# Patient Record
Sex: Male | Born: 1970 | Race: White | Hispanic: No | Marital: Married | Smoking: Current every day smoker
Health system: Southern US, Community
[De-identification: ages and names within clinical notes are randomized; demographics above are authoritative.]

## PROBLEM LIST (undated history)

## (undated) DIAGNOSIS — S20219A Contusion of unspecified front wall of thorax, initial encounter: Secondary | ICD-10-CM

## (undated) DIAGNOSIS — J329 Chronic sinusitis, unspecified: Secondary | ICD-10-CM

## (undated) DIAGNOSIS — B370 Candidal stomatitis: Secondary | ICD-10-CM

## (undated) DIAGNOSIS — S29011A Strain of muscle and tendon of front wall of thorax, initial encounter: Secondary | ICD-10-CM

## (undated) DIAGNOSIS — D72829 Elevated white blood cell count, unspecified: Secondary | ICD-10-CM

## (undated) DIAGNOSIS — J45909 Unspecified asthma, uncomplicated: Secondary | ICD-10-CM

## (undated) DIAGNOSIS — E669 Obesity, unspecified: Secondary | ICD-10-CM

## (undated) DIAGNOSIS — J189 Pneumonia, unspecified organism: Secondary | ICD-10-CM

## (undated) DIAGNOSIS — J310 Chronic rhinitis: Secondary | ICD-10-CM

## (undated) DIAGNOSIS — Z720 Tobacco use: Secondary | ICD-10-CM

## (undated) DIAGNOSIS — G4700 Insomnia, unspecified: Secondary | ICD-10-CM

## (undated) DIAGNOSIS — L02215 Cutaneous abscess of perineum: Secondary | ICD-10-CM

## (undated) DIAGNOSIS — E785 Hyperlipidemia, unspecified: Secondary | ICD-10-CM

## (undated) HISTORY — DX: Pneumonia, unspecified organism: J18.9

## (undated) HISTORY — DX: Tobacco use: Z72.0

## (undated) HISTORY — DX: Unspecified asthma, uncomplicated: J45.909

## (undated) HISTORY — DX: Obesity, unspecified: E66.9

## (undated) HISTORY — DX: Cutaneous abscess of perineum: L02.215

## (undated) HISTORY — DX: Hyperlipidemia, unspecified: E78.5

## (undated) HISTORY — DX: Chronic rhinitis: J31.0

## (undated) HISTORY — PX: HERNIA REPAIR: SHX51

## (undated) HISTORY — DX: Strain of muscle and tendon of front wall of thorax, initial encounter: S29.011A

## (undated) HISTORY — DX: Elevated white blood cell count, unspecified: D72.829

## (undated) HISTORY — DX: Insomnia, unspecified: G47.00

## (undated) HISTORY — DX: Candidal stomatitis: B37.0

## (undated) HISTORY — DX: Chronic sinusitis, unspecified: J32.9

## (undated) HISTORY — DX: Contusion of unspecified front wall of thorax, initial encounter: S20.219A

---

## 2008-06-30 DIAGNOSIS — N491 Inflammatory disorders of spermatic cord, tunica vaginalis and vas deferens: Secondary | ICD-10-CM | POA: Insufficient documentation

## 2008-07-05 DIAGNOSIS — L989 Disorder of the skin and subcutaneous tissue, unspecified: Secondary | ICD-10-CM | POA: Insufficient documentation

## 2010-10-14 ENCOUNTER — Ambulatory Visit: Payer: Self-pay

## 2010-11-08 ENCOUNTER — Ambulatory Visit: Payer: Self-pay

## 2011-04-28 ENCOUNTER — Ambulatory Visit: Payer: Self-pay | Admitting: Family Medicine

## 2011-10-25 ENCOUNTER — Ambulatory Visit: Payer: Self-pay | Admitting: Family Medicine

## 2012-01-17 ENCOUNTER — Observation Stay: Payer: Self-pay | Admitting: Surgery

## 2012-01-17 LAB — CBC WITH DIFFERENTIAL/PLATELET
Basophil #: 0 10*3/uL (ref 0.0–0.1)
Eosinophil #: 0.1 10*3/uL (ref 0.0–0.7)
Eosinophil %: 1.3 %
HGB: 15.3 g/dL (ref 13.0–18.0)
Lymphocyte %: 16.2 %
MCV: 89 fL (ref 80–100)
Neutrophil #: 8.2 10*3/uL — ABNORMAL HIGH (ref 1.4–6.5)
Neutrophil %: 73.6 %
Platelet: 164 10*3/uL (ref 150–440)
RBC: 5.12 10*6/uL (ref 4.40–5.90)
RDW: 13.1 % (ref 11.5–14.5)
WBC: 11.1 10*3/uL — ABNORMAL HIGH (ref 3.8–10.6)

## 2012-01-17 LAB — BASIC METABOLIC PANEL
Anion Gap: 8 (ref 7–16)
BUN: 8 mg/dL (ref 7–18)
Calcium, Total: 8.9 mg/dL (ref 8.5–10.1)
Chloride: 108 mmol/L — ABNORMAL HIGH (ref 98–107)
Co2: 24 mmol/L (ref 21–32)
EGFR (Non-African Amer.): >60
Glucose: 85 mg/dL (ref 65–99)
Osmolality: 277 (ref 275–301)
Potassium: 3.8 mmol/L (ref 3.5–5.1)

## 2012-01-22 LAB — WOUND CULTURE

## 2012-06-20 ENCOUNTER — Ambulatory Visit: Payer: Self-pay | Admitting: Family Medicine

## 2013-01-07 ENCOUNTER — Ambulatory Visit: Payer: Self-pay

## 2013-02-01 ENCOUNTER — Other Ambulatory Visit: Payer: Self-pay | Admitting: Family Medicine

## 2013-02-01 LAB — CBC WITH DIFFERENTIAL/PLATELET
Basophil #: 0.1 10*3/uL (ref 0.0–0.1)
Basophil %: 0.5 %
Eosinophil #: 0.1 10*3/uL (ref 0.0–0.7)
Eosinophil %: 0.5 %
Lymphocyte #: 1.6 10*3/uL (ref 1.0–3.6)
Lymphocyte %: 13.2 %
MCH: 30.7 pg (ref 26.0–34.0)
MCHC: 35.7 g/dL (ref 32.0–36.0)
MCV: 86 fL (ref 80–100)
Monocyte %: 9.7 %
Neutrophil #: 9.5 10*3/uL — ABNORMAL HIGH (ref 1.4–6.5)
Platelet: 175 10*3/uL (ref 150–440)
WBC: 12.4 10*3/uL — ABNORMAL HIGH (ref 3.8–10.6)

## 2013-02-03 ENCOUNTER — Ambulatory Visit: Payer: Self-pay | Admitting: Family Medicine

## 2013-03-18 ENCOUNTER — Ambulatory Visit: Payer: Self-pay | Admitting: Family Medicine

## 2013-04-21 ENCOUNTER — Telehealth: Payer: Self-pay | Admitting: Pulmonary Disease

## 2013-04-21 ENCOUNTER — Encounter: Payer: Self-pay | Admitting: Pulmonary Disease

## 2013-04-21 NOTE — Telephone Encounter (Signed)
Records have been received on pt. ATC pt but # listed is not in service. No alternate #. Will sign off

## 2013-04-22 ENCOUNTER — Encounter: Payer: Self-pay | Admitting: Pulmonary Disease

## 2013-04-22 ENCOUNTER — Encounter (INDEPENDENT_AMBULATORY_CARE_PROVIDER_SITE_OTHER): Payer: Self-pay

## 2013-04-22 ENCOUNTER — Ambulatory Visit (INDEPENDENT_AMBULATORY_CARE_PROVIDER_SITE_OTHER): Payer: Federal, State, Local not specified - PPO | Admitting: Pulmonary Disease

## 2013-04-22 VITALS — BP 134/94 | HR 73 | Temp 98.4°F | Ht 75.0 in | Wt 276.0 lb

## 2013-04-22 DIAGNOSIS — Z720 Tobacco use: Secondary | ICD-10-CM

## 2013-04-22 DIAGNOSIS — J189 Pneumonia, unspecified organism: Secondary | ICD-10-CM

## 2013-04-22 DIAGNOSIS — R9389 Abnormal findings on diagnostic imaging of other specified body structures: Secondary | ICD-10-CM

## 2013-04-22 DIAGNOSIS — R918 Other nonspecific abnormal finding of lung field: Secondary | ICD-10-CM

## 2013-04-22 DIAGNOSIS — F17200 Nicotine dependence, unspecified, uncomplicated: Secondary | ICD-10-CM

## 2013-04-22 DIAGNOSIS — J449 Chronic obstructive pulmonary disease, unspecified: Secondary | ICD-10-CM

## 2013-04-22 NOTE — Progress Notes (Signed)
Subjective:    Patient ID: Wayne Gonzalez, male    DOB: Nov 24, 1970, 42 y.o.   MRN: [id number]  HPI   this is a very pleasant 42 year old male who comes to our clinic today for evaluation of shortness of breath, cough, and recurrent pneumonia. He started smoking at age 31 and has smoked about one pack a day since then. This is been associated with a chronic cough with yellow phlegm production on a daily basis.  As a child used to have recurrent bronchitis which looking back, he questions whether or not he may of had some asthma.  Over his adult life he has had recurrent bronchitis at least twice a year. However, in 2012 he started developing pneumonia on a recurrent basis. He believes that he has had it least 3 times since then. He has been treated multiple times with multiple antibiotics.  Most recently he had an episode of bronchitis in September of 2014. During this time he had an increase cough he actually initially could not get up in the sputum. After about a week or so he started producing a milky white sputum.   In June of 2014 he was diagnosed with sinusitis. This eventually lead to a worsening cough with associated fever. He was treated with Levaquin for 10 days. During this time he was also treated with 20 days of doxycycline because of the recurrent fever for possible tick borne illness. Apparently labwork was all negative at that time. He had a fever intermittently with these symptoms. He was treated with Levaquin for approximately  He is from [place] originally but ever since moving to [place] he has had seasonal allergies. This is mostly nasal congestion which persists most of the time. He takes Claritin and a nasal steroid which helped.   Past Medical History  Diagnosis Date  . Obesity   . Perineal abscess   . Contusion, chest wall   . Insomnia   . Chest wall muscle strain   . Reactive airway disease   . Thrush, oral   . PNA (pneumonia)   . Elevated WBC count   .  Rhinitis   . Asthmatic bronchitis   . Hyperlipidemia   . Tobacco use     quit 05/14/2007  . Sinusitis      Family History  Problem Relation Age of Onset  . Emphysema Paternal Grandfather     smoked  . Emphysema Paternal Grandmother     smoked  . Lung cancer Paternal Grandfather     smoked  . Lung cancer Maternal Grandmother     smoked     History   Social History  . Marital Status: Married    Spouse Name: N/A    Number of Children: N/A  . Years of Education: N/A   Occupational History  . Teaching laboratory technician    Social History Main Topics  . Smoking status: Current Every Day Smoker -- 1.00 packs/day for 22 years    Types: Cigarettes  . Smokeless tobacco: Never Used  . Alcohol Use: No     Comment: occassional  . Drug Use: No  . Sexual Activity: Not on file   Other Topics Concern  . Not on file   Social History Narrative  . No narrative on file     No Known Allergies   Outpatient Prescriptions Prior to Visit  Medication Sig Dispense Refill  . atorvastatin (LIPITOR) 20 MG tablet Take 20 mg by mouth daily.      Marland Kitchen  fish oil-omega-3 fatty acids 1000 MG capsule Take 1 g by mouth daily.      . fluticasone (FLONASE) 50 MCG/ACT nasal spray Place 2 sprays into the nose daily.      . Fluticasone-Salmeterol (ADVAIR) 250-50 MCG/DOSE AEPB Inhale 1 puff into the lungs every 12 (twelve) hours.      . Loratadine (CLARITIN) 10 MG CAPS Take 1 capsule by mouth daily.      . montelukast (SINGULAIR) 10 MG tablet Take 10 mg by mouth at bedtime.      . Multiple Vitamin (MULTI-VITAMIN DAILY PO) Take 1 tablet by mouth daily.       No facility-administered medications prior to visit.      Review of Systems  Constitutional: Negative for fever, chills, activity change and appetite change.  HENT: Positive for congestion. Negative for ear pain, hearing loss, postnasal drip, rhinorrhea, sinus pressure and sneezing.   Eyes: Negative for redness, itching and visual  disturbance.  Respiratory: Positive for cough. Negative for chest tightness, shortness of breath and wheezing.   Cardiovascular: Negative for chest pain, palpitations and leg swelling.  Gastrointestinal: Negative for nausea, vomiting, abdominal pain, diarrhea, constipation, blood in stool and abdominal distention.  Musculoskeletal: Negative for arthralgias, gait problem, joint swelling, myalgias, neck pain and neck stiffness.  Skin: Negative for rash.  Neurological: Negative for dizziness, light-headedness, numbness and headaches.  Hematological: Does not bruise/bleed easily.  Psychiatric/Behavioral: Negative for confusion and dysphoric mood.       Objective:   Physical Exam Filed Vitals:   04/22/13 1548  BP: 134/94  Pulse: 73  Temp: 98.4 F (36.9 C)  TempSrc: Oral  Height: 6\' 3"  (1.905 m)  Weight: 276 lb (125.193 kg)  SpO2: 96%  RA Walked 500 feet in office on RA and did not have significant desaturation  Gen: well appearing, no acute distress HEENT: NCAT, PERRL, EOMi, OP clear, neck supple without masses PULM: CTA B CV: RRR, no mgr, no JVD AB: BS+, soft, nontender, no hsm Ext: warm, no edema, no clubbing, no cyanosis Derm: no rash or skin breakdown Neuro: A&Ox4, CN II-XII intact, strength 5/5 in all 4 extremities  Multiple chest x-rays were reviewed, including the most recent from October of 2014 which showed clear lungs bilaterally with the exception of a very small amount of linear atelectasis in the bases records and left.  04/22/2013 simple spirometry (without bronchodilator)>> ratio 66%, FEV1 3.67 L (78% predicted)      Assessment & Plan:   Chronic airway obstruction, not elsewhere classified Wayne Gonzalez is at risk for having COPD. Today he had airflow obstruction on his simple spirometry test but this was not performed after the use of a bronchodilator. Therefore, it's not clear to me if this is COPD or asthma. He has had multiple episodes of bronchitis throughout  his life which make me question whether or not there is some underlying asthma. This is never really been thoroughly evaluated in the past.   So at this point we are going to treat him with Advair which would treat both asthma and COPD. He needs to quit smoking immediately. We will set up. Pulmonary function testing at [facility] with bronchodilator to see if in fact he does have COPD.  Plan: -Full PFT with bronchodilator -Continue Advair -Quit smoking, quit smoking, quit smoking  Tobacco abuse We talked about this at length today. He wants to try quit cold [place] at this point. We talked about the side effects and benefits of Chantix. At this  point he is not interested in using that.  Abnormal chest x-ray He had a very mild amount of linear scarring in the base of his lung which I believe was related to prior episodes of pneumonia. His physical exam was normal today. I explained to him that these findings are nonspecific.  Plan: -Quit smoking -Full pulmonary function test -Repeat chest x-ray in one month after quitting smoking -If no change in scarring then order a CT scan  Recurrent pneumonia I believe that his recurrent sinopulmonary infections are do to his tobacco use first, perhaps an underlying obstructive lung disease (see discussion above) and less likely an immune deficiency of some sort. His most recent bloodwork which I was able to review thanks to the records sent by his primary care office show a normal white count and normal neutrophil count. I am going to order complement studies as well as an immunoglobulin profile to look for evidence of an underlying immunodeficiency of some sort.    Updated Medication List Outpatient Encounter Prescriptions as of 04/22/2013  Medication Sig Dispense Refill  . atorvastatin (LIPITOR) 20 MG tablet Take 20 mg by mouth daily.      . fish oil-omega-3 fatty acids 1000 MG capsule Take 1 g by mouth daily.      . fluticasone (FLONASE) 50 MCG/ACT  nasal spray Place 2 sprays into the nose daily.      . Fluticasone-Salmeterol (ADVAIR) 250-50 MCG/DOSE AEPB Inhale 1 puff into the lungs every 12 (twelve) hours.      . Loratadine (CLARITIN) 10 MG CAPS Take 1 capsule by mouth daily.      . montelukast (SINGULAIR) 10 MG tablet Take 10 mg by mouth at bedtime.      . Multiple Vitamin (MULTI-VITAMIN DAILY PO) Take 1 tablet by mouth daily.      Marland Kitchen terbinafine (LAMISIL) 250 MG tablet Take 250 mg by mouth daily.       No facility-administered encounter medications on file as of 04/22/2013.

## 2013-04-22 NOTE — Patient Instructions (Addendum)
Quit smoking, quit smoking, quit smoking We will set you up for full pulmonary function testing at [facility] with and without a bronchodilator We will have you get a Chest X-ray at [facility] before the next visit We will call you with the results of your blood work We will see you back in 4 weeks or sooner if needed

## 2013-04-23 DIAGNOSIS — J449 Chronic obstructive pulmonary disease, unspecified: Secondary | ICD-10-CM | POA: Insufficient documentation

## 2013-04-23 DIAGNOSIS — Z720 Tobacco use: Secondary | ICD-10-CM | POA: Insufficient documentation

## 2013-04-23 DIAGNOSIS — R9389 Abnormal findings on diagnostic imaging of other specified body structures: Secondary | ICD-10-CM | POA: Insufficient documentation

## 2013-04-23 DIAGNOSIS — J189 Pneumonia, unspecified organism: Secondary | ICD-10-CM | POA: Insufficient documentation

## 2013-04-23 LAB — IGG, IGA, IGM
IgA: 288 mg/dL (ref 68–379)
IgG (Immunoglobin G), Serum: 1070 mg/dL (ref 650–1600)
IgM, Serum: 112 mg/dL (ref 41–251)

## 2013-04-23 NOTE — Assessment & Plan Note (Addendum)
Wayne Gonzalez is at risk for having COPD. Today he had airflow obstruction on his simple spirometry test but this was not performed after the use of a bronchodilator. Therefore, it's not clear to me if this is COPD or asthma. He has had multiple episodes of bronchitis throughout his life which make me question whether or not there is some underlying asthma. This is never really been thoroughly evaluated in the past.   So at this point we are going to treat him with Advair which would treat both asthma and COPD. He needs to quit smoking immediately. We will set up. Pulmonary function testing at [facility] with bronchodilator to see if in fact he does have COPD.  Plan: -Full PFT with bronchodilator -Continue Advair -Quit smoking, quit smoking, quit smoking

## 2013-04-23 NOTE — Assessment & Plan Note (Signed)
He had a very mild amount of linear scarring in the base of his lung which I believe was related to prior episodes of pneumonia. His physical exam was normal today. I explained to him that these findings are nonspecific.  Plan: -Quit smoking -Full pulmonary function test -Repeat chest x-ray in one month after quitting smoking -If no change in scarring then order a CT scan

## 2013-04-23 NOTE — Assessment & Plan Note (Signed)
We talked about this at length today. He wants to try quit cold [place] at this point. We talked about the side effects and benefits of Chantix. At this point he is not interested in using that.

## 2013-04-23 NOTE — Assessment & Plan Note (Signed)
I believe that his recurrent sinopulmonary infections are do to his tobacco use first, perhaps an underlying obstructive lung disease (see discussion above) and less likely an immune deficiency of some sort. His most recent bloodwork which I was able to review thanks to the records sent by his primary care office show a normal white count and normal neutrophil count. I am going to order complement studies as well as an immunoglobulin profile to look for evidence of an underlying immunodeficiency of some sort.

## 2013-04-24 LAB — COMPLEMENT, TOTAL: Compl, Total (CH50): 51 U/mL (ref 31–60)

## 2013-04-24 NOTE — Progress Notes (Signed)
Quick Note:  Advised pt of labs per DM. Pt aware not all labs in and will be notified if any are abnormal.  Pt verbalized understanding and has no further questions ______

## 2013-05-01 ENCOUNTER — Ambulatory Visit: Payer: Self-pay | Admitting: Pulmonary Disease

## 2013-05-01 LAB — PULMONARY FUNCTION TEST

## 2013-05-08 ENCOUNTER — Telehealth: Payer: Self-pay | Admitting: *Deleted

## 2013-05-08 ENCOUNTER — Encounter: Payer: Self-pay | Admitting: Pulmonary Disease

## 2013-05-08 NOTE — Telephone Encounter (Signed)
Message copied by Caryl Ada on Thu May 08, 2013  4:57 PM ------      Message from: Max Fickle B      Created: Thu May 08, 2013  6:48 AM       L,            Please let him know that his PFT's showed mild COPD.  I don't have any changes to what I told him at the last visit.             Thanks      B ------

## 2013-05-08 NOTE — Telephone Encounter (Signed)
Pt is aware of PFT results. 

## 2013-05-20 ENCOUNTER — Ambulatory Visit: Payer: Federal, State, Local not specified - PPO | Admitting: Pulmonary Disease

## 2013-05-26 ENCOUNTER — Ambulatory Visit: Payer: Self-pay | Admitting: Pulmonary Disease

## 2013-05-28 ENCOUNTER — Encounter: Payer: Self-pay | Admitting: Pulmonary Disease

## 2013-05-28 ENCOUNTER — Ambulatory Visit (INDEPENDENT_AMBULATORY_CARE_PROVIDER_SITE_OTHER): Payer: Federal, State, Local not specified - PPO | Admitting: Pulmonary Disease

## 2013-05-28 VITALS — BP 146/94 | HR 70 | Temp 98.6°F | Ht 75.0 in | Wt 278.0 lb

## 2013-05-28 DIAGNOSIS — F17200 Nicotine dependence, unspecified, uncomplicated: Secondary | ICD-10-CM

## 2013-05-28 DIAGNOSIS — Z720 Tobacco use: Secondary | ICD-10-CM

## 2013-05-28 DIAGNOSIS — J449 Chronic obstructive pulmonary disease, unspecified: Secondary | ICD-10-CM

## 2013-05-28 DIAGNOSIS — J189 Pneumonia, unspecified organism: Secondary | ICD-10-CM

## 2013-05-28 NOTE — Assessment & Plan Note (Signed)
Extensive discussion today regarding the importance of quitting smoking. In fact, I counseled him for approximately 30 minutes regarding COPD education and the importance of quitting smoking. I am hopeful that he is going to quit cold [place] soon.

## 2013-05-28 NOTE — Progress Notes (Signed)
Subjective:    Patient ID: Wayne Gonzalez, male    DOB: 1971-06-07, 42 y.o.   MRN: [id number]  Synopsis: Mr. Couts first saw the [facility] Pulmonary office in November 2014 for shortness of breath.  He is an active smoker and was diagnosed with COPD at that time: 04/22/2013 simple spirometry (without bronchodilator)>> ratio 66%, FEV1 3.67 L (78% predicted) 04/2013 Full PFT Ratio 64% FEV1 3.79L (88% pred), only 4% change with Bronchodilator; TLC 7.52 L (91% pre)RV 1.53 (64% pre), ERV 0.78 (40% pred), DLCO 27.6 (77% pred) He has frequent exacerbations  HPI  05/28/2013 > Mr. Villamizar has been doing well since the last visit. His cough has nearly completely resolved and is now back to his "smoker's cough". He has been using the Advair and feels like it it helps control chest congestion and shortness of breath. He did note some shortness of breath with yard work about a week ago. In general, he is feeling much better than he was back in the summertime.  Past Medical History  Diagnosis Date  . Obesity   . Perineal abscess   . Contusion, chest wall   . Insomnia   . Chest wall muscle strain   . Reactive airway disease   . Thrush, oral   . PNA (pneumonia)   . Elevated WBC count   . Rhinitis   . Asthmatic bronchitis   . Hyperlipidemia   . Tobacco use     quit 05/14/2007  . Sinusitis      Review of Systems  Constitutional: Negative for fever, chills and fatigue.  HENT: Negative for postnasal drip, rhinorrhea and sinus pressure.   Respiratory: Positive for cough and shortness of breath. Negative for wheezing.   Cardiovascular: Negative for chest pain, palpitations and leg swelling.       Objective:   Physical Exam  Filed Vitals:   05/28/13 1609  BP: 146/94  Pulse: 70  Temp: 98.6 F (37 C)  TempSrc: Oral  Height: 6\' 3"  (1.905 m)  Weight: 278 lb (126.1 kg)  SpO2: 96%   Gen: well appearing HEENT: NCAT, EOMi PULM: CTA B CV: RRR, no MGR AB: soft, nontender Ext: warm, no  edema  05/26/2013 CXR reviewed> chronic scar R base     Assessment & Plan:   COPD He definitely has COPD which showed an FEV1 of 88% predicted on pulmonary function testing at [facility]. Because of his frequent exacerbations I consider him a gold grade C.  Plan: -Quit smoking, quit smoking, quit smoking -Continue Advair -Exercise regularly -Flu shot is up-to-date -Pneumonia vaccine today  Recurrent pneumonia No evidence of immunodeficiency seen on lab workup..  The chest x-ray from [place] just shows a chronic scar in the right base which has not changed. We will repeat a chest x-ray in about 6 months to make sure this has not changed.  Tobacco abuse Extensive discussion today regarding the importance of quitting smoking. In fact, I counseled him for approximately 30 minutes regarding COPD education and the importance of quitting smoking. I am hopeful that he is going to quit cold [place] soon.    Updated Medication List Outpatient Encounter Prescriptions as of 05/28/2013  Medication Sig  . atorvastatin (LIPITOR) 20 MG tablet Take 20 mg by mouth daily.  . fish oil-omega-3 fatty acids 1000 MG capsule Take 1 g by mouth daily.  . fluticasone (FLONASE) 50 MCG/ACT nasal spray Place 2 sprays into the nose daily.  . Fluticasone-Salmeterol (ADVAIR) 250-50 MCG/DOSE AEPB  Inhale 1 puff into the lungs every 12 (twelve) hours.  . Loratadine (CLARITIN) 10 MG CAPS Take 1 capsule by mouth daily.  . montelukast (SINGULAIR) 10 MG tablet Take 10 mg by mouth at bedtime.  . Multiple Vitamin (MULTI-VITAMIN DAILY PO) Take 1 tablet by mouth daily.  Marland Kitchen terbinafine (LAMISIL) 250 MG tablet Take 250 mg by mouth daily.

## 2013-05-28 NOTE — Assessment & Plan Note (Addendum)
He definitely has COPD which showed an FEV1 of 88% predicted on pulmonary function testing at [facility]. Because of his frequent exacerbations I consider him a gold grade C.  Plan: -Quit smoking, quit smoking, quit smoking -Continue Advair -Exercise regularly -Flu shot is up-to-date -Pneumonia vaccine today

## 2013-05-28 NOTE — Patient Instructions (Signed)
Stop smoking Keep using the Advair twice a day Exercise regularly We will see you back in June 2015 with a Chest X-ray or sooner if needed

## 2013-05-28 NOTE — Assessment & Plan Note (Addendum)
No evidence of immunodeficiency seen on lab workup..  The chest x-ray from Wayne Gonzalez just shows a chronic scar in the right base which has not changed. We will repeat a chest x-ray in about 6 months to make sure this has not changed.

## 2013-05-29 ENCOUNTER — Encounter: Payer: Self-pay | Admitting: Pulmonary Disease

## 2013-05-30 ENCOUNTER — Encounter: Payer: Self-pay | Admitting: Pulmonary Disease

## 2013-07-09 ENCOUNTER — Encounter: Payer: Self-pay | Admitting: Pulmonary Disease

## 2014-02-27 LAB — BASIC METABOLIC PANEL
BUN: 10 mg/dL (ref 4–21)
Creatinine: 1 mg/dL (ref 0.6–1.3)
Glucose: 89 mg/dL
Potassium: 4.4 mmol/L (ref 3.4–5.3)
Sodium: 139 mmol/L (ref 137–147)

## 2014-02-27 LAB — CBC AND DIFFERENTIAL
HCT: 48 % (ref 41–53)
Hemoglobin: 16.6 g/dL (ref 13.5–17.5)
Platelets: 215 10*3/uL (ref 150–399)
WBC: 6.9 10^3/mL

## 2014-02-27 LAB — LIPID PANEL
Cholesterol: 146 mg/dL (ref 0–200)
HDL: 31 mg/dL — AB (ref 35–70)
LDL Cholesterol: 96 mg/dL
Triglycerides: 95 mg/dL (ref 40–160)

## 2014-02-27 LAB — HEMOGLOBIN A1C: Hgb A1c MFr Bld: 16.6 % — AB (ref 4.0–6.0)

## 2014-02-27 LAB — HEPATIC FUNCTION PANEL
ALT: 35 U/L (ref 10–40)
AST: 33 U/L (ref 14–40)

## 2014-03-09 ENCOUNTER — Ambulatory Visit: Payer: Self-pay | Admitting: Family Medicine

## 2014-10-13 NOTE — Op Note (Signed)
PATIENT NAME:  Wayne Gonzalez, Wayne Gonzalez MR#:  [id number] DATE OF BIRTH:  10-25-1970  DATE OF PROCEDURE:  01/17/2012  PREOPERATIVE DIAGNOSIS: Perineal abscess.   POSTOPERATIVE DIAGNOSIS: Perineal abscess.  PROCEDURES PERFORMED: Examination under anesthesia, Foley catheter placement, and incision and drainage of perineal abscess.   SURGEON: Molly Savarino E. Excell Seltzerooper, MD  ASSISTANT: Mack HookNicole Papsworth-Jones  ANESTHESIA: General with LMA   INDICATIONS: This is a patient with a perineal abscess requiring incision and drainage. Preoperatively we discussed rationale for surgery, the options of observation, risk of bleeding, infection, recurrence, and open wound. This was reviewed for him and his wife. They understood and agreed to proceed.   FINDINGS: Foley catheter was placed in close proximity to the perineal urethra prior to drainage and then the drainage was performed to the right of the midline with a large amount of pus exuding, cultures were taken, and a Foley catheter was left in place.   DESCRIPTION OF PROCEDURE: The patient was induced to general anesthesia and then placed into a well-padded high lithotomy position by the nursing staff. He was then prepped and draped in a sterile fashion. Examination under anesthesia demonstrated that this was very close to the perineal body and the midline perineum; therefore, a Foley catheter was placed in the standard fashion using aseptic technique. It had been prepped into the field. Palpation demonstrated an area of fluctuance posterior and to the right of the midline. An incision was made and enlarged to allow for good drainage. Cultures were taken of a large amount of pus. Drain irrigation was performed and then packing with 1 inch Nu Gauze was performed. A sterile dressing was placed after cultures were taken and he was taken down out of lithotomy position. The catheter was left in place.   The patient tolerated this procedure well. There were no complications. He was  taken to the recovery room in stable condition to be admitted for continued care.  ____________________________ Adah Salvageichard E. Excell Seltzerooper, MD rec:slb D: 01/17/2012 09:41:42 ET T: 01/17/2012 10:04:46 ET JOB#: [id number]  cc: Adah Salvageichard E. Excell Seltzerooper, MD, <Dictator> Lattie HawICHARD E Ranessa Kosta MD ELECTRONICALLY SIGNED 01/17/2012 10:55

## 2014-10-13 NOTE — H&P (Signed)
PATIENT NAME:  Wayne Gonzalez, [name] MR#:  [id number] DATE OF BIRTH:  12/05/1970  DATE OF ADMISSION:  01/17/2012  PRIMARY CARE PHYSICIAN: Mila Merryonald Fisher, MD   ADMITTING PHYSICIAN: Dr. Michela PitcherEly   CHIEF COMPLAINT: Perirectal pain with abscess.   BRIEF HISTORY: Wayne Gonzalez is a 44 year old gentleman with large perineal abscess. He's had symptoms over the last several days. He went to his primary care physician today and was set up for surgical evaluation. The pain worsened over the course of the afternoon and he felt that he could not wait until he was evaluated in the office. He presented to the Emergency Room for further evaluation and intervention. Work-up in the Emergency Room revealed a large abscess on physical examination and white blood cell count of 11,000. Abdominal/pelvic CT was performed without p.o. contrast and this study confirmed the presence of a large abscess. He does have history of a pilonidal cyst and abscess which has drained previously. He has also had a history of multiple previous perineal abscesses which have all drained spontaneously, essentially one per year. This is the first abscess that has not drained spontaneously.   He denies history of hepatitis, yellow jaundice, pancreatitis, peptic ulcer disease, gallbladder disease, diverticulitis, or inflammatory bowel disease. He has no history of cardiac disease, hypertension, or diabetes. He does have history of hyperlipidemia.   FAMILY HISTORY: Diabetes and coronary artery disease.   CURRENT MEDICATIONS: Generic Lipitor.   ALLERGIES: He has no medical allergies.   SOCIAL HISTORY: He is a cigarette smoker. Drinks alcohol occasionally. He is a Technical sales engineerreal estate appraiser. His wife accompanies him tonight.   REVIEW OF SYSTEMS: Review of systems is carried out and is unremarkable.    PHYSICAL EXAMINATION:   GENERAL: He is an alert slightly agitated gentleman in no significant distress.   VITAL SIGNS: He remains afebrile. Blood pressure  121/80, heart rate 92 and regular, pain on a scale of 1 to 10 is a 5.   HEENT: Unremarkable. He has no scleral icterus. Pupils were equally round. There is no facial deformity.   NECK: Supple without adenopathy. Trachea is midline. I cannot palpate his thyroid gland.   CHEST: Clear with no adventitious sounds. He has normal pulmonary excursion.   CARDIAC: No murmurs or gallops. He seems to be in normal sinus rhythm.   ABDOMEN: His abdomen is benign with no abdominal tenderness. No rebound. No guarding. No masses. No hernias.   RECTAL: Rectal exam demonstrates a large 8 to 9 cm fluctuant abscess at the 12 o'clock position in lithotomy. There is no spontaneous drainage. The skin is not discolored but it is obviously soft. There is no other rectal abnormality noted on inspection. Digital examination was not performed. Scrotum appears to be uninvolved.   EXTREMITIES: Lower extremity exam reveals full range of motion. No deformities. Good distal pulses.   PSYCHIATRIC: Normal affect other than some mild anxiety and normal orientation.   I have independently reviewed his CT scan. He does appear to have a large perineal abscess. Will recommend admitting him to the hospital for observation, place him on IV antibiotics and IV rehydration, keep him n.p.o. and perform incision and drainage procedure in the morning. This plan has been outlined for the patient and his wife and they are in agreement.   ____________________________ Carmie Endalph L. Ely III, MD rle:drc D: 01/17/2012 02:52:35 ET T: 01/17/2012 08:08:27 ET JOB#: [id number] cc: Carmie Endalph L. Ely III, MD, <Dictator>, Demetrios Isaacsonald E. Sherrie MustacheFisher, MD Quentin OreALPH L ELY MD ELECTRONICALLY SIGNED 01/18/2012 1:23

## 2014-10-13 NOTE — Discharge Summary (Signed)
PATIENT NAME:  Wayne Gonzalez, Wayne Gonzalez MR#:  [id number] DATE OF BIRTH:  16-May-1971  DATE OF ADMISSION:  01/17/2012 DATE OF DISCHARGE:  01/18/2012  DIAGNOSES:  1. Perineal abscess.  2. Hypercholesterolemia.   PROCEDURES: Incision and drainage of perineal abscess and Foley catheter placement intraoperatively with exam under anesthesia.   HISTORY OF PRESENT ILLNESS/HOSPITAL COURSE: This is a 44 year old Caucasian male patient. He has had previous infections of a similar nature but he presented with increasing pain and mass sensation in his perineum. There were clear-cut signs of perineal abscess but a CT scan had been performed suggesting and confirming the presence of a perineal abscess. He was taken to the operating room where a Foley catheter was placed due to the proximity of the perineal urethra and an incision and drainage was performed. Initial Gram stain showed few or rare Gram-positive cocci and many white blood cells. Cultures are currently pending. He is discharged in stable condition after the packing has been removed and the catheter has been removed to follow up in my office next week. He will be given Keflex orally q.i.d. and Percocet for pain.    ____________________________ Adah Salvageichard E. Excell Seltzerooper, MD rec:bjt D: 01/18/2012 08:04:30 ET T: 01/18/2012 13:02:05 ET JOB#: [id number]  cc: Adah Salvageichard E. Excell Seltzerooper, MD, <Dictator> Lattie HawICHARD E Sicilia Killough MD ELECTRONICALLY SIGNED 01/18/2012 18:30

## 2014-12-25 ENCOUNTER — Encounter: Payer: Self-pay | Admitting: Family Medicine

## 2014-12-25 ENCOUNTER — Ambulatory Visit (INDEPENDENT_AMBULATORY_CARE_PROVIDER_SITE_OTHER): Payer: Federal, State, Local not specified - PPO | Admitting: Family Medicine

## 2014-12-25 VITALS — BP 138/90 | HR 88 | Temp 98.7°F | Resp 16 | Ht 75.0 in | Wt 282.0 lb

## 2014-12-25 DIAGNOSIS — G4700 Insomnia, unspecified: Secondary | ICD-10-CM | POA: Insufficient documentation

## 2014-12-25 DIAGNOSIS — Z716 Tobacco abuse counseling: Secondary | ICD-10-CM | POA: Diagnosis not present

## 2014-12-25 DIAGNOSIS — E785 Hyperlipidemia, unspecified: Secondary | ICD-10-CM | POA: Insufficient documentation

## 2014-12-25 DIAGNOSIS — R509 Fever, unspecified: Secondary | ICD-10-CM | POA: Diagnosis not present

## 2014-12-25 DIAGNOSIS — J40 Bronchitis, not specified as acute or chronic: Secondary | ICD-10-CM | POA: Diagnosis not present

## 2014-12-25 DIAGNOSIS — J45909 Unspecified asthma, uncomplicated: Secondary | ICD-10-CM | POA: Insufficient documentation

## 2014-12-25 DIAGNOSIS — D72829 Elevated white blood cell count, unspecified: Secondary | ICD-10-CM | POA: Insufficient documentation

## 2014-12-25 DIAGNOSIS — E669 Obesity, unspecified: Secondary | ICD-10-CM | POA: Insufficient documentation

## 2014-12-25 DIAGNOSIS — Z720 Tobacco use: Secondary | ICD-10-CM

## 2014-12-25 DIAGNOSIS — J309 Allergic rhinitis, unspecified: Secondary | ICD-10-CM | POA: Insufficient documentation

## 2014-12-25 DIAGNOSIS — J069 Acute upper respiratory infection, unspecified: Secondary | ICD-10-CM

## 2014-12-25 MED ORDER — LEVOFLOXACIN 500 MG PO TABS: 500.0000 mg | ORAL_TABLET | Freq: Every day | ORAL | Status: DC

## 2014-12-25 MED ORDER — PREDNISONE 10 MG PO TABS: 10.0000 mg | ORAL_TABLET | Freq: Every day | ORAL | Status: DC

## 2014-12-25 NOTE — Progress Notes (Signed)
Subjective:    Patient ID: Wayne Gonzalez, male    DOB: [birth year], 44 y.o.   MRN: [id number]  URI  This is a new problem. The current episode started in the past 7 days. The problem has been gradually worsening. The maximum temperature recorded prior to his arrival was 100.4 - 100.9 F. The fever has been present for 3 to 4 days. Associated symptoms include coughing, rhinorrhea and wheezing. Pertinent negatives include no abdominal pain, chest pain, congestion, diarrhea, ear pain, headaches, nausea, plugged ear sensation, sinus pain, sneezing, sore throat or vomiting. He has tried NSAIDs for the symptoms. The treatment provided mild relief.   Patient Active Problem List   Diagnosis Date Noted  . Allergic rhinitis 12/25/2014  . Airway hyperreactivity 12/25/2014  . COPD, moderate 12/25/2014  . Elevated WBC count 12/25/2014  . Combined fat and carbohydrate induced hyperlipemia 12/25/2014  . Cannot sleep 12/25/2014  . Adiposity 12/25/2014  . RAD (reactive airway disease) 12/25/2014  . COPD 04/23/2013  . Tobacco abuse 04/23/2013  . Abnormal chest x-ray 04/23/2013  . Recurrent pneumonia 04/23/2013  . Disorder of skin 07/05/2008  . Abscess of vas deferens 06/30/2008  . Current tobacco use 05/14/2007   Family History  Problem Relation Age of Onset  . Emphysema Paternal Grandfather     smoked  . Lung cancer Paternal Grandfather     smoked  . Emphysema Paternal Grandmother     smoked  . Lung cancer Maternal Grandmother     smoked  . Depression Maternal Grandmother   . Hyperlipidemia Father   . Hypertension Father   . Fibromyalgia Father   . Hypothyroidism Mother    History   Social History  . Marital Status: Married    Spouse Name: N/A  . Number of Children: 2  . Years of Education: College   Occupational History  . Teaching laboratory technician    Social History Main Topics  . Smoking status: Current Every Day Smoker -- 1.00 packs/day for 22 years    Types: Cigarettes   . Smokeless tobacco: Never Used  . Alcohol Use: No     Comment: Rarely Rarely  . Drug Use: No  . Sexual Activity: Not on file   Other Topics Concern  . Not on file   Social History Narrative   Past Surgical History  Procedure Laterality Date  . Hernia repair      x 2    No Known Allergies Previous Medications   ALBUTEROL (VENTOLIN HFA) 108 (90 BASE) MCG/ACT INHALER    Inhale into the lungs.   ATORVASTATIN (LIPITOR) 20 MG TABLET    Take 20 mg by mouth daily.   FISH OIL-OMEGA-3 FATTY ACIDS 1000 MG CAPSULE    Take 1 g by mouth daily.   FLUTICASONE (FLONASE) 50 MCG/ACT NASAL SPRAY    Place 2 sprays into the nose daily.   FLUTICASONE-SALMETEROL (ADVAIR) 250-50 MCG/DOSE AEPB    Inhale 1 puff into the lungs every 12 (twelve) hours.   LORATADINE (CLARITIN) 10 MG CAPS    Take 1 capsule by mouth daily.   MONTELUKAST (SINGULAIR) 10 MG TABLET    Take 10 mg by mouth at bedtime.   MULTIPLE VITAMIN (MULTI-VITAMIN DAILY PO)    Take 1 tablet by mouth daily.   OXYMETAZOLINE (AFRIN 12 HOUR) 0.05 % NASAL SPRAY    Place into the nose.   TERBINAFINE (LAMISIL) 250 MG TABLET    Take 250 mg by mouth daily.    BP  138/90 mmHg  Pulse 88  Temp(Src) 98.7 F (37.1 C) (Oral)  Resp 16  Ht  (1.905 m)  Wt 282 lb (127.914 kg)  BMI 35.25 kg/m2  SpO2 96%     Review of Systems  Constitutional: Positive for fever ( Low grade fever the last three nights.  ), chills and fatigue. Negative for activity change, appetite change and unexpected weight change.  HENT: Positive for rhinorrhea. Negative for congestion, dental problem, drooling, ear discharge, ear pain, facial swelling, hearing loss, mouth sores, nosebleeds, sinus pressure, sneezing, sore throat, tinnitus, trouble swallowing and voice change.   Eyes: Negative for photophobia, pain, discharge, redness, itching and visual disturbance.  Respiratory: Positive for cough, chest tightness, shortness of breath and wheezing. Negative for choking.    Cardiovascular: Negative for chest pain, palpitations and leg swelling.  Gastrointestinal: Negative for nausea, vomiting, abdominal pain, diarrhea, constipation, blood in stool, abdominal distention, anal bleeding and rectal pain.  Neurological: Negative for dizziness, tremors, seizures, syncope, facial asymmetry, speech difficulty, weakness, light-headedness, numbness and headaches.       Objective:   Physical Exam  Constitutional: He is oriented to person, place, and time. He appears well-developed and well-nourished.  Cardiovascular: Normal rate and regular rhythm.   Pulmonary/Chest: Effort normal. Respiratory distress: slight scattered wheezing.  He has wheezes. Tenderness: left lateral ribs.   Neurological: He is alert and oriented to person, place, and time.  Psychiatric: He has a normal mood and affect. His behavior is normal. Judgment and thought content normal.       Assessment & Plan:   1. Upper respiratory infection Worsening, see below.   2. Bronchitis Worsening. Will start medication as below.  Call if worsens or does not improve.  - levofloxacin (LEVAQUIN) 500 MG tablet; Take 1 tablet (500 mg total) by mouth daily.  Dispense: 7 tablet; Refill: 0 - predniSONE (DELTASONE) 10 MG tablet; Take 1 tablet (10 mg total) by mouth daily with breakfast. 6 po for 1 day and 5 po for 1 day and 4 po for 1 day and 3 po for 1 day and 2 po for 1 day and 1 po for 1 day  Dispense: 21 tablet; Refill: 0  3. Fever, unspecified fever cause Suspect related to infection. Will recheck CXR in 4 weeks.  Has history of recurrent pneumonia.    4. Tobacco abuse counseling Stressed importance of cessation.  Will consider Chantix. Discussed cessation at length.  Will call if wants to try medication.   5. Tobacco abuse Still smoking.  Will think about quitting.

## 2015-01-13 ENCOUNTER — Ambulatory Visit (INDEPENDENT_AMBULATORY_CARE_PROVIDER_SITE_OTHER): Payer: Federal, State, Local not specified - PPO | Admitting: Family Medicine

## 2015-01-13 ENCOUNTER — Ambulatory Visit
Admission: RE | Admit: 2015-01-13 | Discharge: 2015-01-13 | Disposition: A | Discharge status: Home / Self Care | Payer: Federal, State, Local not specified - PPO | Source: Ambulatory Visit | Attending: Family Medicine | Admitting: Family Medicine

## 2015-01-13 ENCOUNTER — Encounter: Payer: Self-pay | Admitting: Family Medicine

## 2015-01-13 VITALS — BP 130/88 | HR 92 | Temp 99.0°F | Resp 18 | Wt 283.0 lb

## 2015-01-13 DIAGNOSIS — R05 Cough: Secondary | ICD-10-CM | POA: Diagnosis not present

## 2015-01-13 DIAGNOSIS — R0781 Pleurodynia: Secondary | ICD-10-CM

## 2015-01-13 DIAGNOSIS — R059 Cough, unspecified: Secondary | ICD-10-CM

## 2015-01-13 DIAGNOSIS — X58XXXA Exposure to other specified factors, initial encounter: Secondary | ICD-10-CM | POA: Insufficient documentation

## 2015-01-13 DIAGNOSIS — S2242XA Multiple fractures of ribs, left side, initial encounter for closed fracture: Secondary | ICD-10-CM | POA: Diagnosis not present

## 2015-01-13 DIAGNOSIS — R1012 Left upper quadrant pain: Secondary | ICD-10-CM

## 2015-01-13 LAB — POCT URINALYSIS DIPSTICK
Glucose, UA: NEGATIVE
Ketones, UA: NEGATIVE
Nitrite, UA: NEGATIVE
PH UA: 6
UROBILINOGEN UA: 0.2

## 2015-01-13 NOTE — Progress Notes (Signed)
       Patient: Wayne Gonzalez Male    DOB: 10/11/1970   44 y.o.   MRN: [id number] Visit Date: 01/13/2015  Today's Provider: Mila Merryonald Alayja Armas, MD   Chief Complaint  Patient presents with  . Rib Injury    x 3 weeks   Subjective:    HPI   Patient comes in stating he has pain on his left side below his ribs. Patient states pain has been constant for the past couple of days. Patient reports he injured himself 3 weeks ago while bending over. He states he heard a popping sound. Pain improved over the next few days. Patient states the pain has returned and is lower (by location) 1-2 inches from initial injury. Worse with coughing. Not affected by eating. His wife thinks he is coughing a little more.  No Known Allergies Previous Medications   ALBUTEROL (VENTOLIN HFA) 108 (90 BASE) MCG/ACT INHALER    Inhale into the lungs.   ATORVASTATIN (LIPITOR) 20 MG TABLET    Take 20 mg by mouth daily.   FISH OIL-OMEGA-3 FATTY ACIDS 1000 MG CAPSULE    Take 1 g by mouth daily.   FLUTICASONE (FLONASE) 50 MCG/ACT NASAL SPRAY    Place 2 sprays into the nose daily.   FLUTICASONE-SALMETEROL (ADVAIR) 250-50 MCG/DOSE AEPB    Inhale 1 puff into the lungs every 12 (twelve) hours.   LORATADINE (CLARITIN) 10 MG CAPS    Take 1 capsule by mouth daily.   MONTELUKAST (SINGULAIR) 10 MG TABLET    Take 10 mg by mouth at bedtime.   MULTIPLE VITAMIN (MULTI-VITAMIN DAILY PO)    Take 1 tablet by mouth daily.    Review of Systems  Constitutional: Positive for fever (low grade) and chills. Negative for diaphoresis and fatigue.  HENT: Negative for rhinorrhea, sinus pressure, sneezing and sore throat.   Respiratory: Positive for cough, shortness of breath and wheezing. Negative for chest tightness.   Cardiovascular: Negative for palpitations.  Musculoskeletal: Positive for myalgias (left side).  Neurological: Negative for headaches.    History  Substance Use Topics  . Smoking status: Current Every Day Smoker -- 1.00 packs/day  for 22 years    Types: Cigarettes  . Smokeless tobacco: Never Used  . Alcohol Use: No     Comment: Rarely Rarely   Objective:   BP 130/88 mmHg  Pulse 92  Temp(Src) 99 F (37.2 C) (Oral)  Resp 18  Wt 283 lb (128.368 kg)  SpO2 95%  Physical Exam   General Appearance:    Alert, cooperative, no distress  Eyes:    PERRL, conjunctiva/corneas clear, EOM's intact       Lungs:     Clear to auscultation bilaterally, respirations unlabored  Heart:    Regular rate and rhythm  Neurologic:   Awake, alert, oriented x 3. No apparent focal neurological           defect.   MS:   Moderately tender below left rib cage and left posterior ribs.        Assessment & Plan:     1. Left upper quadrant pain  - POCT urinalysis dipstick - US Abdomen Limited; Future  2. Rib pain on left side  - DG Ribs Unilateral Left; Future  3. Cough  - DG Chest 2 View; Future       Mila Merryonald Detravion Tester, MD  [facility]

## 2015-01-14 ENCOUNTER — Telehealth: Payer: Self-pay

## 2015-01-14 NOTE — Telephone Encounter (Signed)
-----   Message from Malva Limes, MD sent at 01/14/2015  7:16 AM EDT ----- Patient has fractures or eight, ninth, and tenth ribs. Otherwise XR is normal. Need to proceed with abdominal ultrasound since he had pain below ribs. Have sent order for ultrasound for sarah to schedule.

## 2015-01-14 NOTE — Telephone Encounter (Signed)
Patient advised of X ray results. Patient states the pain has improved significantly since yesterday. Patient reports he has not had any fever since last night around 7:30-8pm. Patient advised that he should proceed with Ultrasound. Patient states he will be out of town on vacation all next week and if we were unable to get him him tomorrow for the Ultrasound than he would have to put it off for the following week. Dr. Sherrie Mustache was advised of this and states since patient pain is improving than we can hold off on the Ultrasound. If symptoms worsen patient will call back to have the Ultrasound scheduled. Patient advised and is in agreement with this plan.

## 2015-01-22 ENCOUNTER — Ambulatory Visit: Admission: RE | Admit: 2015-01-22 | Payer: Federal, State, Local not specified - PPO | Source: Ambulatory Visit

## 2015-04-12 ENCOUNTER — Encounter: Payer: Self-pay | Admitting: Family Medicine

## 2015-04-12 ENCOUNTER — Ambulatory Visit (INDEPENDENT_AMBULATORY_CARE_PROVIDER_SITE_OTHER): Payer: Federal, State, Local not specified - PPO | Admitting: Family Medicine

## 2015-04-12 VITALS — BP 118/90 | HR 89 | Temp 99.7°F | Resp 16 | Wt 277.0 lb

## 2015-04-12 DIAGNOSIS — J069 Acute upper respiratory infection, unspecified: Secondary | ICD-10-CM | POA: Diagnosis not present

## 2015-04-12 DIAGNOSIS — R590 Localized enlarged lymph nodes: Secondary | ICD-10-CM | POA: Diagnosis not present

## 2015-04-12 NOTE — Patient Instructions (Signed)
Take 3 (three) 200mg  ibuprofen tablets every six hours as needed for throat pain.

## 2015-04-12 NOTE — Progress Notes (Signed)
Patient: Wayne Gonzalez Male    DOB: 1970/09/19   44 y.o.   MRN: [id number] Visit Date: 04/12/2015  Today's Provider: Mila Merry, MD   Chief Complaint  Patient presents with  . Sore Throat  . Fever   Subjective:    Sore Throat  This is a new problem. The current episode started in the past 7 days (Thursday 04/08/15). The problem has been gradually worsening. Neither side of throat is experiencing more pain than the other. The maximum temperature recorded prior to his arrival was 100.4 - 100.9 F. The fever has been present for 3 to 4 days. The pain is moderate. Associated symptoms include trouble swallowing. Pertinent negatives include no abdominal pain, congestion, coughing, ear discharge, ear pain, headaches, neck pain, shortness of breath, swollen glands or vomiting. He has had no exposure to strep. Treatments tried: sore throat lozanges  The treatment provided mild relief.       No Known Allergies Previous Medications   ALBUTEROL (VENTOLIN HFA) 108 (90 BASE) MCG/ACT INHALER    Inhale into the lungs.   ATORVASTATIN (LIPITOR) 20 MG TABLET    Take 20 mg by mouth daily.   FISH OIL-OMEGA-3 FATTY ACIDS 1000 MG CAPSULE    Take 1 g by mouth daily.   FLUTICASONE (FLONASE) 50 MCG/ACT NASAL SPRAY    Place 2 sprays into the nose daily.   FLUTICASONE-SALMETEROL (ADVAIR) 250-50 MCG/DOSE AEPB    Inhale 1 puff into the lungs every 12 (twelve) hours.   LORATADINE (CLARITIN) 10 MG CAPS    Take 1 capsule by mouth daily.   MONTELUKAST (SINGULAIR) 10 MG TABLET    Take 10 mg by mouth at bedtime.   MULTIPLE VITAMIN (MULTI-VITAMIN DAILY PO)    Take 1 tablet by mouth daily.    Review of Systems  HENT: Positive for postnasal drip and trouble swallowing. Negative for congestion, ear discharge and ear pain.   Respiratory: Negative for cough and shortness of breath.   Cardiovascular: Negative for chest pain and palpitations.  Gastrointestinal: Negative for vomiting and abdominal pain.    Musculoskeletal: Negative for neck pain.  Neurological: Negative for headaches.    Social History  Substance Use Topics  . Smoking status: Current Every Day Smoker -- 1.00 packs/day for 22 years    Types: Cigarettes  . Smokeless tobacco: Never Used  . Alcohol Use: No     Comment: Rarely Rarely   Objective:   BP 118/90 mmHg  Pulse 89  Temp(Src) 99.7 F (37.6 C) (Oral)  Resp 16  Wt 277 lb (125.646 kg)  SpO2 95%  Physical Exam  General Appearance:    Alert, cooperative, no distress  HENT:   bilateral TM normal without fluid or infection, neck has bilateral anterior cervical nodes enlarged, pharynx erythematous without exudate, sinuses nontender and post nasal drip noted  Eyes:    PERRL, conjunctiva/corneas clear, EOM's intact       Lungs:     Clear to auscultation bilaterally, respirations unlabored  Heart:    Regular rate and rhythm  Neurologic:   Awake, alert, oriented x 3. No apparent focal neurological           defect.           Assessment & Plan:      1. Upper respiratory infection  - POCT rapid strep A  2. Lymphadenopathy, cervical   Patient Instructions  Take 3 (three)  ibuprofen tablets every six hours as needed for  throat pain.    Call if symptoms change or if not rapidly improving.          Mila Merryonald Fisher, MD  [facility]

## 2015-04-14 LAB — POCT RAPID STREP A (OFFICE): RAPID STREP A SCREEN: NEGATIVE

## 2015-04-28 ENCOUNTER — Encounter: Payer: Self-pay | Admitting: Family Medicine

## 2015-04-28 ENCOUNTER — Ambulatory Visit (INDEPENDENT_AMBULATORY_CARE_PROVIDER_SITE_OTHER): Payer: Federal, State, Local not specified - PPO | Admitting: Family Medicine

## 2015-04-28 VITALS — BP 128/94 | HR 82 | Temp 99.2°F | Resp 16 | Ht 74.75 in | Wt 279.0 lb

## 2015-04-28 DIAGNOSIS — E785 Hyperlipidemia, unspecified: Secondary | ICD-10-CM | POA: Diagnosis not present

## 2015-04-28 DIAGNOSIS — E669 Obesity, unspecified: Secondary | ICD-10-CM | POA: Diagnosis not present

## 2015-04-28 DIAGNOSIS — Z720 Tobacco use: Secondary | ICD-10-CM

## 2015-04-28 DIAGNOSIS — Z23 Encounter for immunization: Secondary | ICD-10-CM | POA: Diagnosis not present

## 2015-04-28 DIAGNOSIS — Z0000 Encounter for general adult medical examination without abnormal findings: Secondary | ICD-10-CM | POA: Diagnosis not present

## 2015-04-28 DIAGNOSIS — J449 Chronic obstructive pulmonary disease, unspecified: Secondary | ICD-10-CM | POA: Diagnosis not present

## 2015-04-28 MED ORDER — FLUTICASONE-SALMETEROL 250-50 MCG/DOSE IN AEPB: 1.0000 | INHALATION_SPRAY | Freq: Two times a day (BID) | RESPIRATORY_TRACT | Status: DC

## 2015-04-28 MED ORDER — BUPROPION HCL ER (SR) 150 MG PO TB12: ORAL_TABLET | ORAL | Status: DC

## 2015-04-28 MED ORDER — MONTELUKAST SODIUM 10 MG PO TABS: 10.0000 mg | ORAL_TABLET | Freq: Every day | ORAL | Status: DC

## 2015-04-28 MED ORDER — ATORVASTATIN CALCIUM 20 MG PO TABS: 20.0000 mg | ORAL_TABLET | Freq: Every day | ORAL | Status: DC

## 2015-04-28 NOTE — Progress Notes (Signed)
Patient: Wayne Gonzalez, Male    DOB: 03-05-1971, 44 y.o.   MRN: [id number] Visit Date: 04/28/2015  Today's Provider: Mila Merry, MD   Chief Complaint  Patient presents with  . Annual Exam  . Hyperlipidemia  . Asthma  . COPD   Subjective:    Annual physical exam Wayne Gonzalez is a 44 y.o. male who presents today for health maintenance and complete physical. He feels well. He reports exercising . He reports he is sleeping fairly well.  -----------------------------------------------------------------  Lipid/Cholesterol, Follow-up:   Last seen for this1 years ago.  Management changes since that visit include no changes. . Last Lipid Panel:    Component Value Date/Time   CHOL 146 02/27/2014   TRIG 95 02/27/2014   HDL 31* 02/27/2014   LDLCALC 96 02/27/2014    Risk factors for vascular disease include hypercholesterolemia and smoking  He reports good compliance with treatment. He is not having side effects.  Current symptoms include none and have been stable. Weight trend: stable Prior visit with dietician: no Current diet: in general, a "healthy" diet   Current exercise: none  Wt Readings from Last 3 Encounters:  04/28/15 279 lb (126.554 kg)  04/12/15 277 lb (125.646 kg)  01/13/15 283 lb (128.368 kg)    -------------------------------------------------------------------   COPD  Doing well taking Advair daily and occasional [place] of Proair. Has mild daily cough, but no worse than usual   Smoking cessation He has tried nicotine gums which help, but has not been able to quit completely. Has not tried any prescription medications, but have discussed bupropion.   Review of Systems  Constitutional: Negative for fever, chills, appetite change and fatigue.  HENT: Negative for congestion, ear pain, hearing loss, nosebleeds and trouble swallowing.   Eyes: Negative for pain and visual disturbance.  Respiratory: Positive for cough. Negative for chest tightness  and shortness of breath.   Cardiovascular: Negative for chest pain, palpitations and leg swelling.  Gastrointestinal: Negative for nausea, vomiting, abdominal pain, diarrhea, constipation and blood in stool.  Endocrine: Negative for polydipsia, polyphagia and polyuria.  Genitourinary: Negative for dysuria and flank pain.  Musculoskeletal: Negative for myalgias, back pain, joint swelling, arthralgias and neck stiffness.  Skin: Positive for rash. Negative for color change and wound.  Neurological: Negative for dizziness, tremors, seizures, speech difficulty, weakness, light-headedness and headaches.  Psychiatric/Behavioral: Negative for behavioral problems, confusion, sleep disturbance, dysphoric mood and decreased concentration. The patient is not nervous/anxious.   All other systems reviewed and are negative.   Social History He  reports that he has been smoking Cigarettes.  He has a 22 pack-year smoking history. He has never used smokeless tobacco. He reports that he drinks alcohol. He reports that he does not use illicit drugs. Social History   Social History  . Marital Status: Married    Spouse Name: N/A  . Number of Children: 2  . Years of Education: College   Occupational History  . Teaching laboratory technician    Social History Main Topics  . Smoking status: Current Every Day Smoker -- 1.00 packs/day for 22 years    Types: Cigarettes  . Smokeless tobacco: Never Used  . Alcohol Use: 0.0 oz/week    0 Standard drinks or equivalent per week     Comment: Rarely Rarely  . Drug Use: No  . Sexual Activity: Not Asked   Other Topics Concern  . None   Social History Narrative    Patient Active  Problem List   Diagnosis Date Noted  . Allergic rhinitis 12/25/2014  . Airway hyperreactivity 12/25/2014  . Elevated WBC count 12/25/2014  . Hyperlipidemia 12/25/2014  . Insomnia 12/25/2014  . Obesity 12/25/2014  . RAD (reactive airway disease) 12/25/2014  . COPD (chronic  obstructive pulmonary disease) (HCC) 04/23/2013  . Tobacco abuse 04/23/2013  . Abnormal chest x-ray 04/23/2013  . Recurrent pneumonia 04/23/2013  . Disorder of skin 07/05/2008  . Abscess of vas deferens 06/30/2008    Past Surgical History  Procedure Laterality Date  . Hernia repair      x 2     Family History  Family Status  Relation Status Death Age  . Maternal Grandmother Deceased 91  . Father Alive   . Mother Alive   . Sister Alive    His family history includes Depression in his maternal grandmother; Emphysema in his paternal grandfather and paternal grandmother; Fibromyalgia in his father; Hyperlipidemia in his father; Hypertension in his father; Hypothyroidism in his mother; Lung cancer in his maternal grandmother and paternal grandfather.    No Known Allergies  Previous Medications   ALBUTEROL (VENTOLIN HFA) 108 (90 BASE) MCG/ACT INHALER    Inhale into the lungs.   ATORVASTATIN (LIPITOR) 20 MG TABLET    Take 20 mg by mouth daily.   FISH OIL-OMEGA-3 FATTY ACIDS 1000 MG CAPSULE    Take 1 g by mouth daily.   FLUTICASONE (FLONASE) 50 MCG/ACT NASAL SPRAY    Place 2 sprays into the nose daily.   FLUTICASONE-SALMETEROL (ADVAIR) 250-50 MCG/DOSE AEPB    Inhale 1 puff into the lungs every 12 (twelve) hours.   LORATADINE (CLARITIN) 10 MG CAPS    Take 1 capsule by mouth daily.   MONTELUKAST (SINGULAIR) 10 MG TABLET    Take 10 mg by mouth at bedtime.   MULTIPLE VITAMIN (MULTI-VITAMIN DAILY PO)    Take 1 tablet by mouth daily.    Patient Care Team: Malva Limes, MD as PCP - General (Family Medicine)     Objective:   Vitals: BP 128/94 mmHg  Pulse 82  Temp(Src) 99.2 F (37.3 C) (Oral)  Resp 16  Ht 6' 2.75" (1.899 m)  Wt 279 lb (126.554 kg)  BMI 35.09 kg/m2  SpO2 94%   Physical Exam   General Appearance:    Alert, cooperative, no distress, appears stated age, obese  Head:    Normocephalic, without obvious abnormality, atraumatic  Eyes:    PERRL, conjunctiva/corneas  clear, EOM's intact, fundi    benign, both eyes       Ears:    Normal TM's and external ear canals, both ears  Nose:   Nares normal, septum midline, mucosa normal, no drainage   or sinus tenderness  Throat:   Lips, mucosa, and tongue normal; teeth and gums normal  Neck:   Supple, symmetrical, trachea midline, no adenopathy;       thyroid:  No enlargement/tenderness/nodules; no carotid   bruit or JVD  Back:     Symmetric, no curvature, ROM normal, no CVA tenderness  Lungs:     Clear to auscultation bilaterally, respirations unlabored  Chest wall:    No tenderness or deformity  Heart:    Regular rate and rhythm, S1 and S2 normal, no murmur, rub   or gallop  Abdomen:     Soft, non-tender, bowel sounds active all four quadrants,    no masses, no organomegaly  Genitalia:    deferred  Rectal:    deferred  Extremities:  Extremities normal, atraumatic, no cyanosis or edema  Pulses:   2+ and symmetric all extremities  Skin:   Skin color, texture, turgor normal, no rashes or lesions  Lymph nodes:   Cervical, supraclavicular, and axillary nodes normal  Neurologic:   CNII-XII intact. Normal strength, sensation and reflexes      throughout    Depression Screen PHQ 2/9 Scores 04/28/2015  PHQ - 2 Score 0  PHQ- 9 Score 0      Assessment & Plan:     Routine Health Maintenance and Physical Exam  Exercise Activities and Dietary recommendations Goals    None      Immunization History  Administered Date(s) Administered  . Influenza Split 04/07/2013    Health Maintenance  Topic Date Due  . HIV Screening  07/28/1986  . TETANUS/TDAP  07/28/1990  . INFLUENZA VACCINE  01/25/2015      Discussed health benefits of physical activity, and encouraged him to engage in regular exercise appropriate for his age and condition.    --------------------------------------------------------------------  1. Annual physical exam  - Comprehensive metabolic panel  2. Chronic obstructive  pulmonary disease, unspecified COPD type (HCC) Stable on current medications.  - Fluticasone-Salmeterol (ADVAIR) 250-50 MCG/DOSE AEPB; Inhale 1 puff into the lungs every 12 (twelve) hours.  Dispense: 180 each; Refill: 4 - montelukast (SINGULAIR) 10 MG tablet; Take 1 tablet (10 mg total) by mouth at bedtime.  Dispense: 90 tablet; Refill: 4  3. Hyperlipidemia He is tolerating atorvastatin well with no adverse effects.   - Lipid panel - atorvastatin (LIPITOR) 20 MG tablet; Take 1 tablet (20 mg total) by mouth daily.  Dispense: 90 tablet; Refill: 4  4. Obesity Reduce calories and increase exrcise  5. Tobacco abuse Counseled on importance of smoking cessation. Discussed prescription medications and OTC nicotine replacement. He will consider bupropion and given rx to fill if he decides to try it.  - buPROPion (WELLBUTRIN SR) 150 MG 12 hr tablet; 1 tablet daily for 3 days, then 1 tablet twice daily. Stop smoking 14 days after starting medication  Dispense: 60 tablet; Refill: 3  6. Need for diphtheria-tetanus-pertussis (Tdap) vaccine  - Tdap vaccine greater than or equal to 7yo IM

## 2015-04-30 ENCOUNTER — Telehealth: Payer: Self-pay | Admitting: Family Medicine

## 2015-04-30 LAB — COMPREHENSIVE METABOLIC PANEL
ALK PHOS: 68 IU/L (ref 39–117)
ALT: 35 IU/L (ref 0–44)
AST: 33 IU/L (ref 0–40)
Albumin/Globulin Ratio: 1.7 (ref 1.1–2.5)
Albumin: 4.1 g/dL (ref 3.5–5.5)
BILIRUBIN TOTAL: 0.5 mg/dL (ref 0.0–1.2)
BUN / CREAT RATIO: 11 (ref 9–20)
BUN: 9 mg/dL (ref 6–24)
CHLORIDE: 105 mmol/L (ref 97–106)
CO2: 21 mmol/L (ref 18–29)
Calcium: 9.1 mg/dL (ref 8.7–10.2)
Creatinine, Ser: 0.85 mg/dL (ref 0.76–1.27)
GFR calc Af Amer: 123 mL/min/{1.73_m2} (ref >59)
GFR calc non Af Amer: 107 mL/min/{1.73_m2} (ref >59)
GLUCOSE: 92 mg/dL (ref 65–99)
Globulin, Total: 2.4 g/dL (ref 1.5–4.5)
Potassium: 4.2 mmol/L (ref 3.5–5.2)
Sodium: 142 mmol/L (ref 136–144)
Total Protein: 6.5 g/dL (ref 6.0–8.5)

## 2015-04-30 LAB — LIPID PANEL
CHOL/HDL RATIO: 4.2 ratio (ref 0.0–5.0)
Cholesterol, Total: 123 mg/dL (ref 100–199)
HDL: 29 mg/dL — AB (ref >39)
LDL CALC: 72 mg/dL (ref 0–99)
Triglycerides: 108 mg/dL (ref 0–149)
VLDL CHOLESTEROL CAL: 22 mg/dL (ref 5–40)

## 2015-04-30 NOTE — Telephone Encounter (Signed)
Pt is returning call.  WU#[phone]/NFCB#[phone]/MW

## 2015-04-30 NOTE — Telephone Encounter (Signed)
Returned call. Patient wanted to go over his lab results.

## 2015-06-18 ENCOUNTER — Other Ambulatory Visit: Payer: Self-pay

## 2015-06-18 DIAGNOSIS — J449 Chronic obstructive pulmonary disease, unspecified: Secondary | ICD-10-CM

## 2015-06-18 MED ORDER — FLUTICASONE PROPIONATE 50 MCG/ACT NA SUSP: 2.0000 | Freq: Every day | NASAL | Status: DC

## 2015-06-18 MED ORDER — ALBUTEROL SULFATE HFA 108 (90 BASE) MCG/ACT IN AERS: 2.0000 | INHALATION_SPRAY | Freq: Four times a day (QID) | RESPIRATORY_TRACT | Status: DC | PRN

## 2015-06-18 NOTE — Telephone Encounter (Signed)
Patient's wife called requesting refills on medication. Patient is currently out. [facility] [address] [place]!

## 2015-06-22 ENCOUNTER — Telehealth: Payer: Self-pay | Admitting: Family Medicine

## 2015-06-22 MED ORDER — ALBUTEROL SULFATE HFA 108 (90 BASE) MCG/ACT IN AERS: 2.0000 | INHALATION_SPRAY | Freq: Four times a day (QID) | RESPIRATORY_TRACT | Status: DC | PRN

## 2015-06-22 NOTE — Telephone Encounter (Signed)
Ok to send one into local pharmacy for him?

## 2015-06-22 NOTE — Telephone Encounter (Signed)
Pt's wife called last week needing a refill on albuterol (VENTOLIN HFA) 108 (90 BASE) MCG/ACT inhaler He is completely out.  It apprears we did the mail order.  She wants [facility] or to see if we had a sample because he is completley out .  ThanksTeri

## 2015-06-23 ENCOUNTER — Other Ambulatory Visit: Payer: Self-pay | Admitting: Family Medicine

## 2015-06-23 MED ORDER — ALBUTEROL SULFATE HFA 108 (90 BASE) MCG/ACT IN AERS: 2.0000 | INHALATION_SPRAY | Freq: Four times a day (QID) | RESPIRATORY_TRACT | Status: DC | PRN

## 2015-06-23 NOTE — Addendum Note (Signed)
Addended by: Mila MerryFISHER, Ashante E on: 06/23/2015 10:20 PM   Modules accepted: Orders

## 2015-11-19 ENCOUNTER — Ambulatory Visit (INDEPENDENT_AMBULATORY_CARE_PROVIDER_SITE_OTHER): Payer: Federal, State, Local not specified - PPO | Admitting: Family Medicine

## 2015-11-19 ENCOUNTER — Encounter: Payer: Self-pay | Admitting: Family Medicine

## 2015-11-19 VITALS — BP 112/82 | HR 85 | Temp 98.6°F | Resp 16 | Ht 74.75 in | Wt 265.0 lb

## 2015-11-19 DIAGNOSIS — B370 Candidal stomatitis: Secondary | ICD-10-CM | POA: Diagnosis not present

## 2015-11-19 DIAGNOSIS — R509 Fever, unspecified: Secondary | ICD-10-CM | POA: Diagnosis not present

## 2015-11-19 DIAGNOSIS — M791 Myalgia, unspecified site: Secondary | ICD-10-CM

## 2015-11-19 MED ORDER — NYSTATIN 100000 UNIT/ML MT SUSP: 5.0000 mL | Freq: Four times a day (QID) | OROMUCOSAL | Status: DC

## 2015-11-19 MED ORDER — DOXYCYCLINE HYCLATE 100 MG PO TABS: 100.0000 mg | ORAL_TABLET | Freq: Two times a day (BID) | ORAL | Status: AC

## 2015-11-19 NOTE — Progress Notes (Signed)
Patient: Wayne Gonzalez Male    DOB: 06-06-1971   45 y.o.   MRN: [id number] Visit Date: 11/19/2015  Today's Provider: Mila Merry, MD   Chief Complaint  Patient presents with  . Fatigue  . Headache  . Insect Bite   Subjective:    HPI  Thursday 11/11/2015 patient stated having a headache that lasted until Saturday 11/13/2015. Patient also began having chills, fatigue, low-grade fever and headaches. Saturday night patient thinks he pulled a tick off left upper chest area around 11/13/15. Patient could not see well enough to tell if it was a tick. Patient has continued to feel fatigued and has had a low-grade fever every night. Patient took aspirin last night to help with symptoms.     No Known Allergies Previous Medications   ALBUTEROL (VENTOLIN HFA) 108 (90 BASE) MCG/ACT INHALER    Inhale 2 puffs into the lungs every 6 (six) hours as needed for wheezing or shortness of breath.   ATORVASTATIN (LIPITOR) 20 MG TABLET    Take 1 tablet (20 mg total) by mouth daily.   BUPROPION (WELLBUTRIN SR) 150 MG 12 HR TABLET    1 tablet daily for 3 days, then 1 tablet twice daily. Stop smoking 14 days after starting medication   FISH OIL-OMEGA-3 FATTY ACIDS 1000 MG CAPSULE    Take 1 g by mouth daily.   FLUTICASONE (FLONASE) 50 MCG/ACT NASAL SPRAY    Place 2 sprays into both nostrils daily.   FLUTICASONE-SALMETEROL (ADVAIR) 250-50 MCG/DOSE AEPB    Inhale 1 puff into the lungs every 12 (twelve) hours.   LORATADINE (CLARITIN) 10 MG CAPS    Take 1 capsule by mouth daily.   MONTELUKAST (SINGULAIR) 10 MG TABLET    Take 1 tablet (10 mg total) by mouth at bedtime.   MULTIPLE VITAMIN (MULTI-VITAMIN DAILY PO)    Take 1 tablet by mouth daily.    Review of Systems  Constitutional: Positive for fever and fatigue. Negative for chills and appetite change.  Respiratory: Negative for chest tightness, shortness of breath and wheezing.   Cardiovascular: Negative for chest pain and palpitations.  Neurological:  Positive for headaches.    Social History  Substance Use Topics  . Smoking status: Current Every Day Smoker -- 1.00 packs/day for 22 years    Types: Cigarettes  . Smokeless tobacco: Never Used  . Alcohol Use: 0.0 oz/week    0 Standard drinks or equivalent per week     Comment: Rarely Rarely   Objective:   BP 112/82 mmHg  Pulse 85  Temp(Src) 98.6 F (37 C) (Oral)  Resp 16  Ht 6' 2.75" (1.899 m)  Wt 265 lb (120.203 kg)  BMI 33.33 kg/m2  SpO2 94%  Physical Exam  General Appearance:    Alert, cooperative, no distress  HENT:   bilateral TM normal without fluid or infection, neck has bilateral anterior cervical nodes enlarged, pharynx erythematous without exudate, sinuses nontender. Mild thrush noted.   Eyes:    PERRL, conjunctiva/corneas clear, EOM's intact       Lungs:     Clear to auscultation bilaterally, respirations unlabored  Heart:    Regular rate and rhythm  Neurologic:   Awake, alert, oriented x 3. No apparent focal neurological           defect.           Assessment & Plan:     1. Myalgia  - CBC With Differential - Comprehensive metabolic panel -  Lyme Disease Abs IgG, IgM, IFA, CSF - B. Burgdorfi Antibodies - Comprehensive metabolic panel - CBC with Differential/Platelet  2. Other specified fever Likely deer tick exposure. Check labs. Start empiric doxycycline 100mg  twice a day for 7 days while awaiting lab results.  - CBC With Differential - Comprehensive metabolic panel - Lyme Disease Abs IgG, IgM, IFA, CSF - B. Burgdorfi Antibodies - Comprehensive metabolic panel - CBC with Differential/Platelet  3. Thrush He states he has been using OTC mouth rinse for a few weeks and is greatly improved, but won't clear up completely.  - nystatin (MYCOSTATIN) 100000 UNIT/ML suspension; Take 5 mLs (500,000 Units total) by mouth 4 (four) times daily.  Dispense: 60 mL; Refill: 0       Mila Merryonald Fisher, MD  [facility]

## 2015-11-22 LAB — COMPREHENSIVE METABOLIC PANEL
ALT: 33 IU/L (ref 0–44)
AST: 31 IU/L (ref 0–40)
Albumin/Globulin Ratio: 1.4 (ref 1.2–2.2)
Albumin: 4.1 g/dL (ref 3.5–5.5)
Alkaline Phosphatase: 73 IU/L (ref 39–117)
BUN/Creatinine Ratio: 9 (ref 9–20)
BUN: 9 mg/dL (ref 6–24)
Bilirubin Total: 1.4 mg/dL — ABNORMAL HIGH (ref 0.0–1.2)
CALCIUM: 9.2 mg/dL (ref 8.7–10.2)
CHLORIDE: 102 mmol/L (ref 96–106)
CO2: 20 mmol/L (ref 18–29)
Creatinine, Ser: 0.95 mg/dL (ref 0.76–1.27)
GFR, EST AFRICAN AMERICAN: 112 mL/min/{1.73_m2} (ref >59)
GFR, EST NON AFRICAN AMERICAN: 97 mL/min/{1.73_m2} (ref >59)
GLUCOSE: 86 mg/dL (ref 65–99)
Globulin, Total: 2.9 g/dL (ref 1.5–4.5)
Potassium: 4 mmol/L (ref 3.5–5.2)
Sodium: 138 mmol/L (ref 134–144)
TOTAL PROTEIN: 7 g/dL (ref 6.0–8.5)

## 2015-11-22 LAB — CBC WITH DIFFERENTIAL/PLATELET
Basophils Absolute: 0 10*3/uL (ref 0.0–0.2)
Basos: 0 %
EOS (ABSOLUTE): 0.2 10*3/uL (ref 0.0–0.4)
Eos: 2 %
HEMOGLOBIN: 15.7 g/dL (ref 12.6–17.7)
Hematocrit: 46.1 % (ref 37.5–51.0)
IMMATURE GRANS (ABS): 0 10*3/uL (ref 0.0–0.1)
IMMATURE GRANULOCYTES: 0 %
LYMPHS: 21 %
Lymphocytes Absolute: 2.1 10*3/uL (ref 0.7–3.1)
MCH: 30.1 pg (ref 26.6–33.0)
MCHC: 34.1 g/dL (ref 31.5–35.7)
MCV: 88 fL (ref 79–97)
MONOCYTES: 9 %
Monocytes Absolute: 0.9 10*3/uL (ref 0.1–0.9)
NEUTROS PCT: 68 %
Neutrophils Absolute: 7.1 10*3/uL — ABNORMAL HIGH (ref 1.4–7.0)
PLATELETS: 203 10*3/uL (ref 150–379)
RBC: 5.22 x10E6/uL (ref 4.14–5.80)
RDW: 12.9 % (ref 12.3–15.4)
WBC: 10.4 10*3/uL (ref 3.4–10.8)

## 2015-11-22 LAB — B. BURGDORFI ANTIBODIES

## 2015-12-08 ENCOUNTER — Other Ambulatory Visit: Payer: Self-pay | Admitting: Family Medicine

## 2015-12-08 DIAGNOSIS — B370 Candidal stomatitis: Secondary | ICD-10-CM

## 2015-12-08 MED ORDER — NYSTATIN 100000 UNIT/ML MT SUSP: 5.0000 mL | Freq: Four times a day (QID) | OROMUCOSAL | Status: DC

## 2015-12-08 NOTE — Telephone Encounter (Signed)
Pt needs refill on his nystatin (MYCOSTATIN) 100000 UNIT/ML suspension  He uses [facility]  Pt's call back is [phone]  Thanks Barth Kirkseri

## 2016-02-28 IMAGING — CR DG CHEST 2V
1 series · 2 of 2 positions shown · non-contrast
Comparison: 03/09/2014.

CLINICAL DATA: Left side anterior pain since [REDACTED], popping
sensation 3 weeks ago, initial encounter.

EXAM:
CHEST  2 VIEW

[Series 1: dg chest 2 view · 0.14mm/px · 2 of 2 slices shown]
[im 1/2]
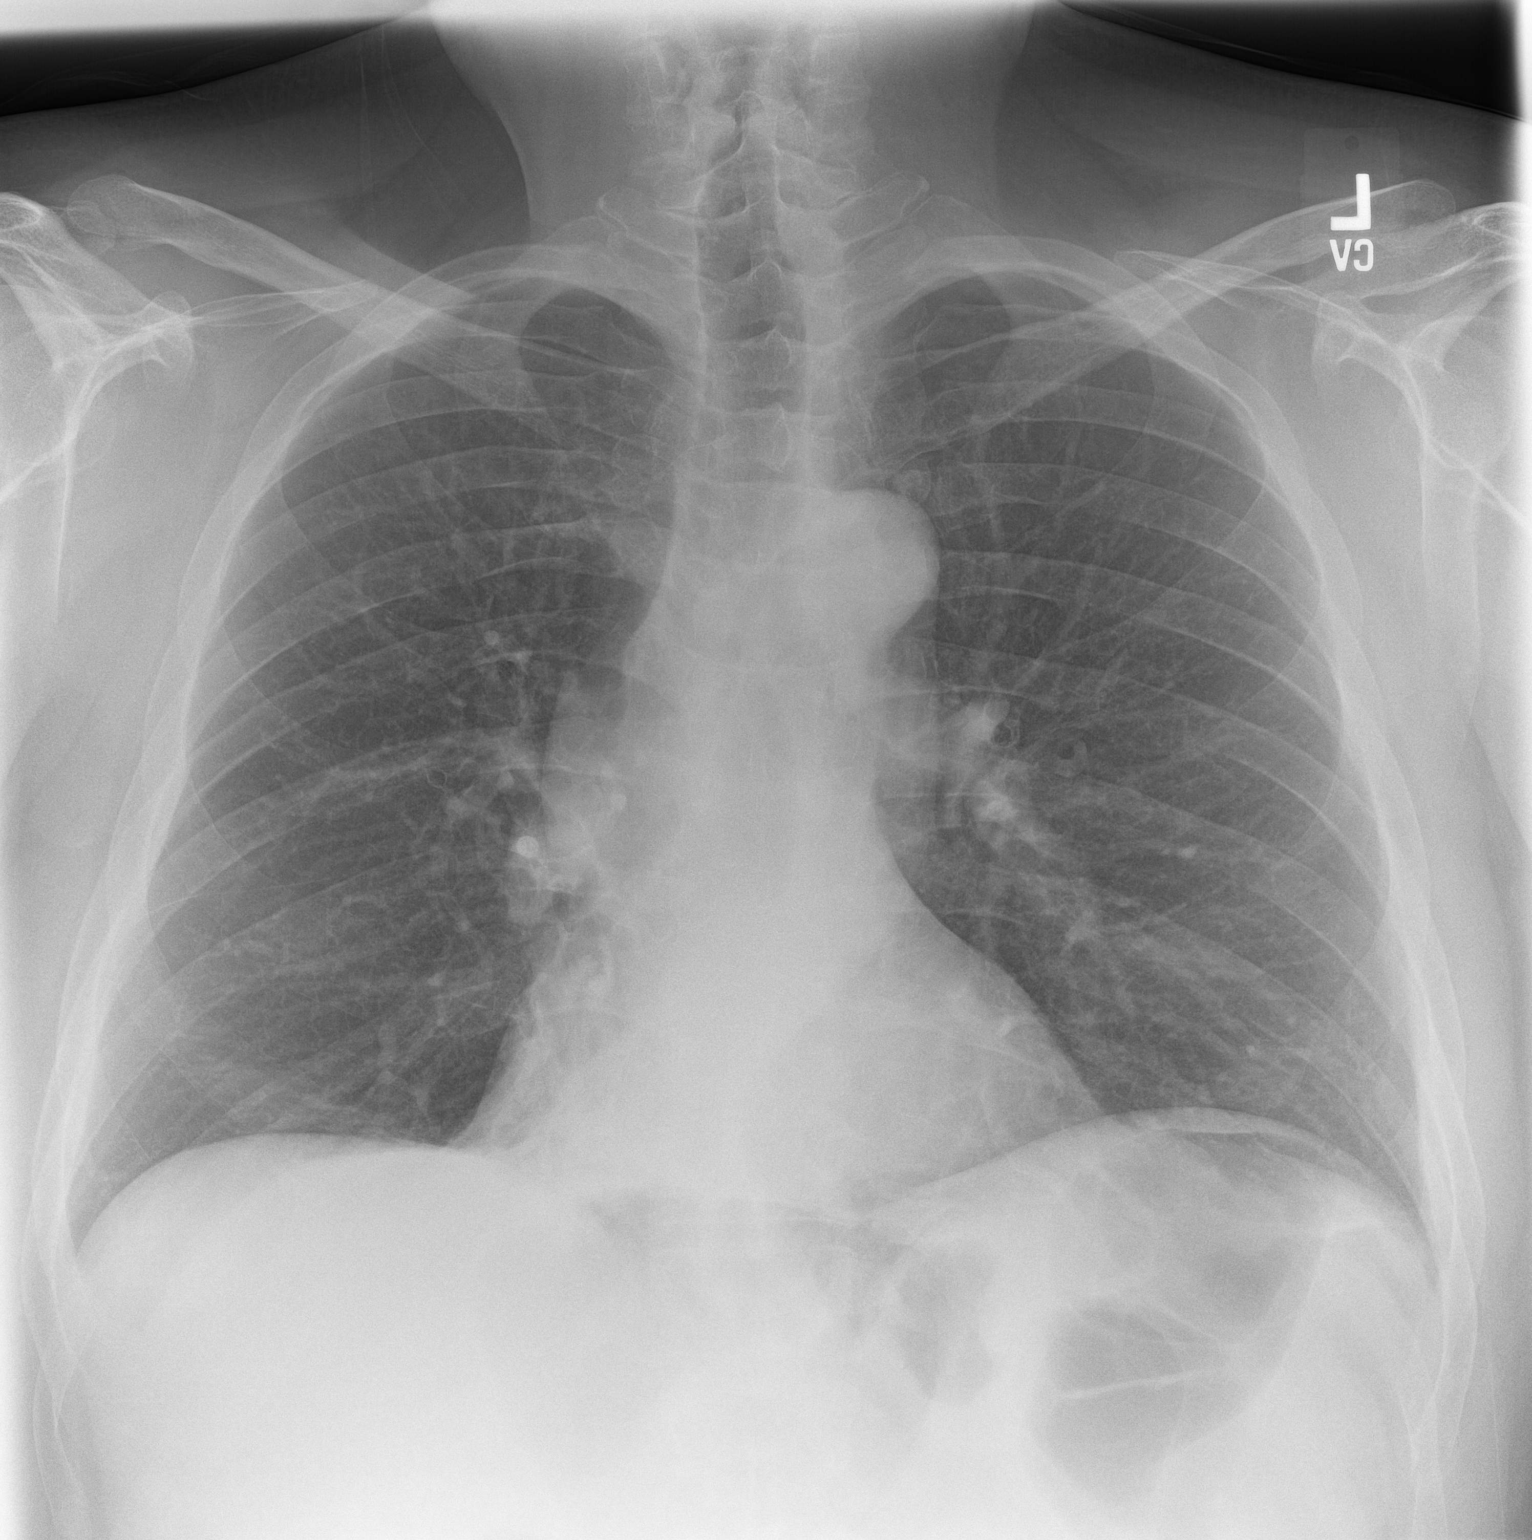
[im 2/2]
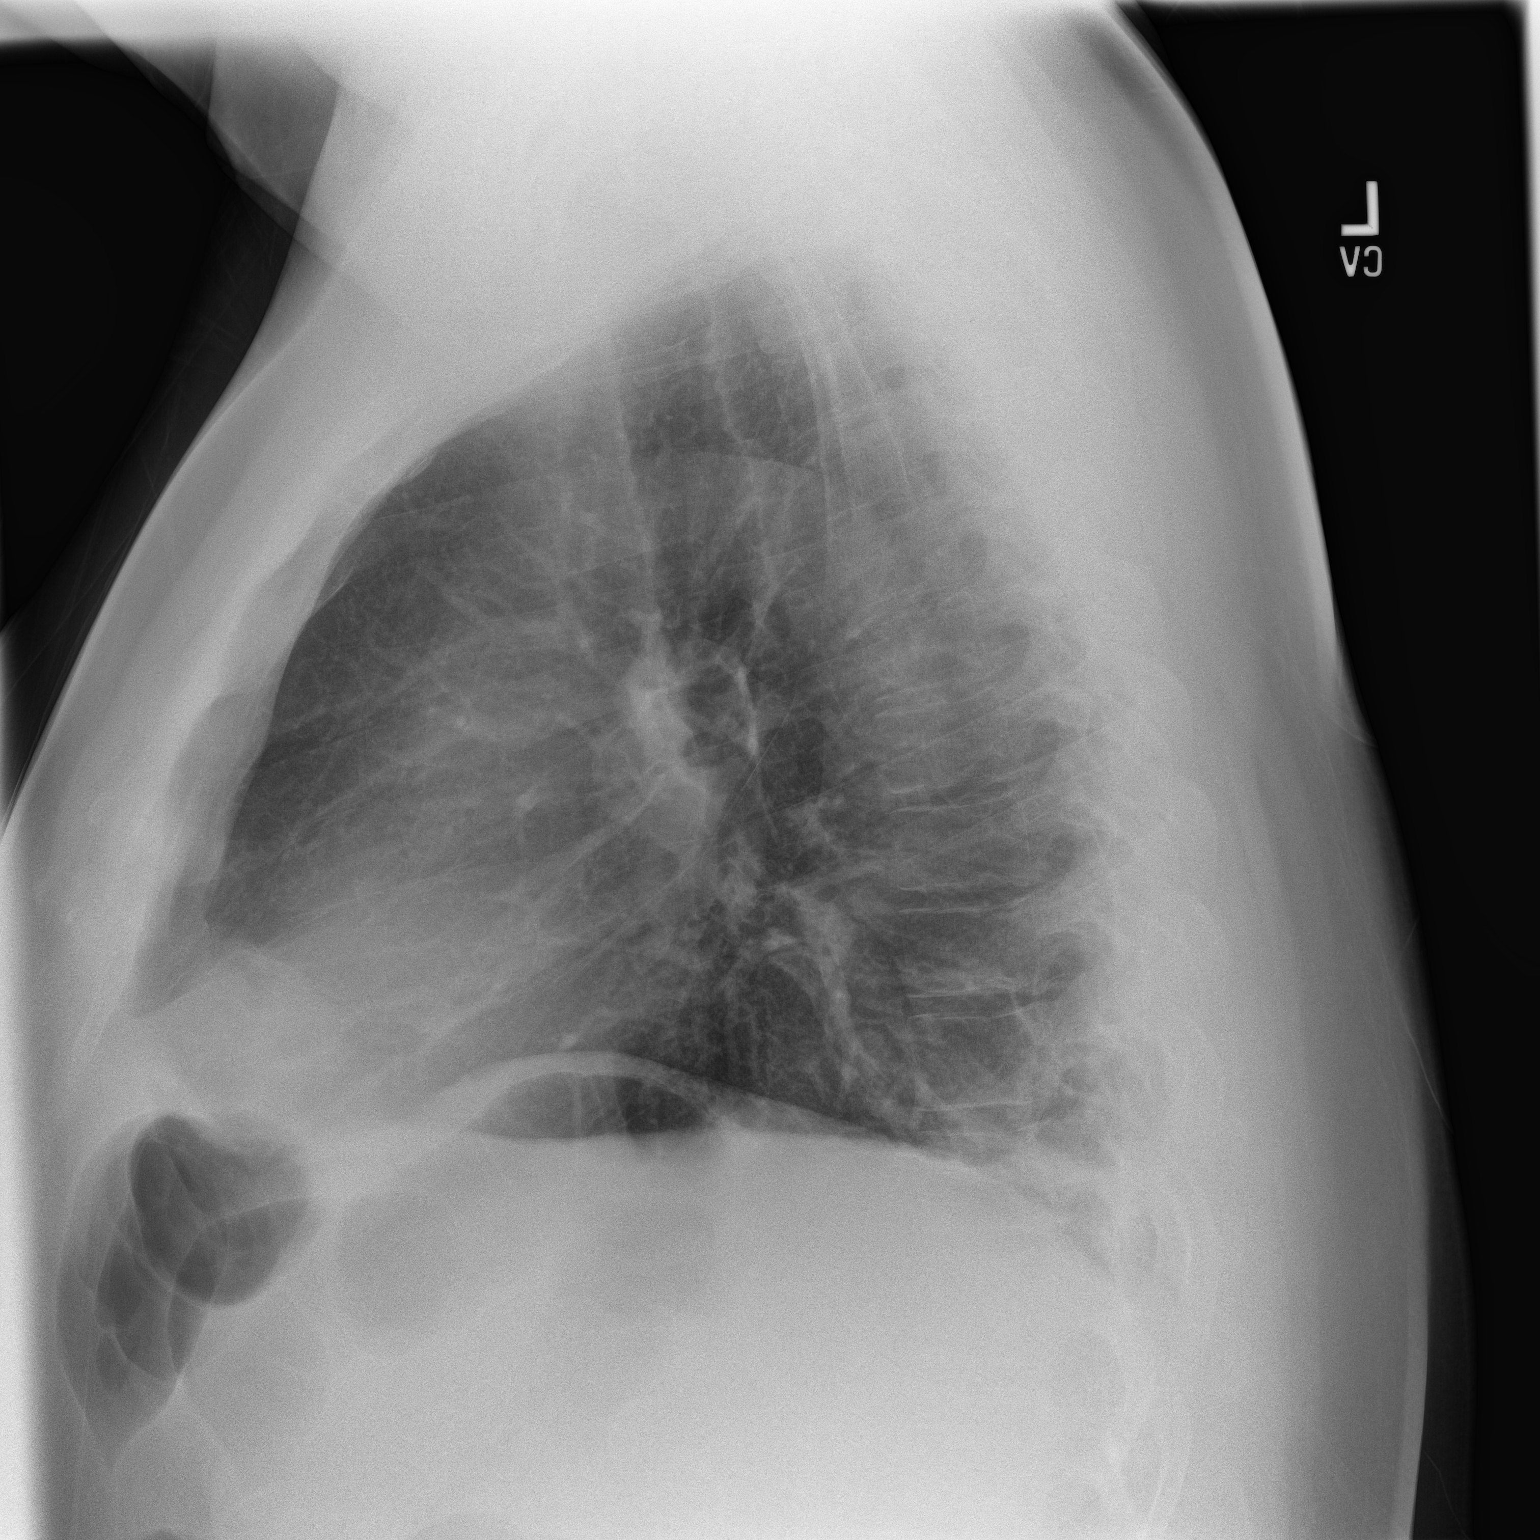

[2 of 2 positions shown; findings below may reference images not displayed]

FINDINGS: Trachea is midline. Heart size normal. Minimal linear subsegmental
atelectasis or scarring at the right lung base. Lungs are otherwise
clear. No pleural fluid. No pneumothorax. Nondisplaced fracture of
the anterior left ninth rib.
IMPRESSION: 1. Nondisplaced fracture of the anterior left ninth rib. Please see
dedicated rib series dictated separately.
2. Otherwise, no acute findings in the chest.

## 2016-02-28 IMAGING — CR DG RIBS 2V*L*
1 series · 4 of 4 positions shown · non-contrast
Comparison: Chest radiographs 03/09/2014, 01/13/2015

CLINICAL DATA: Anterior LEFT chest pain since [REDACTED], history
COPD, smoking

EXAM:
LEFT RIBS - 2 VIEW

[Series 1: dg ribs unilateral left · 0.14mm/px · 4 of 4 slices shown]
[im 1/4]
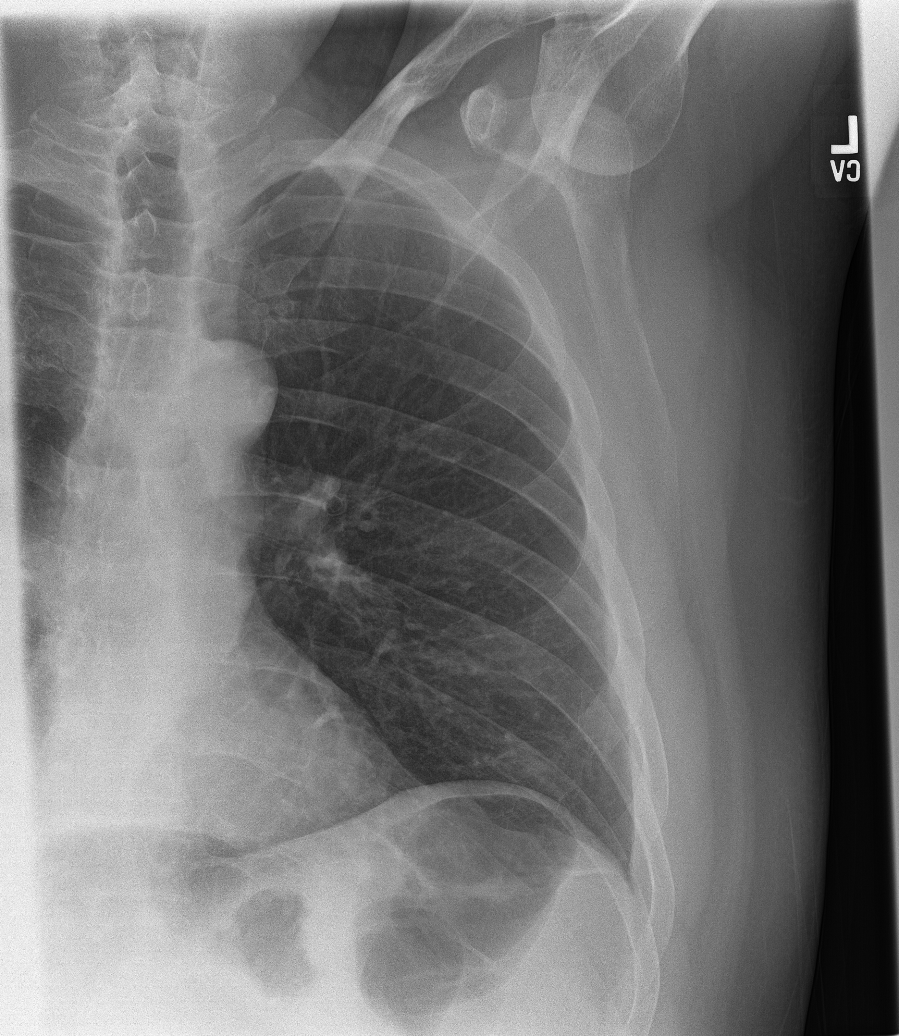
[im 2/4]
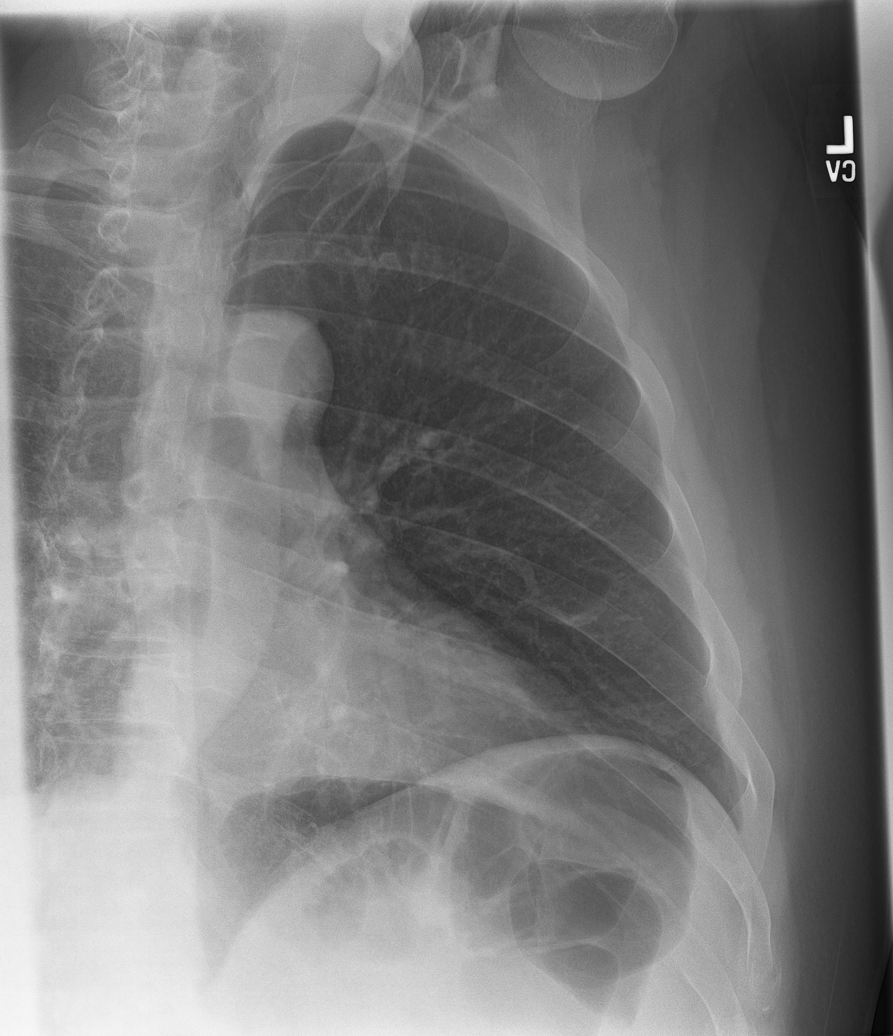
[im 3/4]
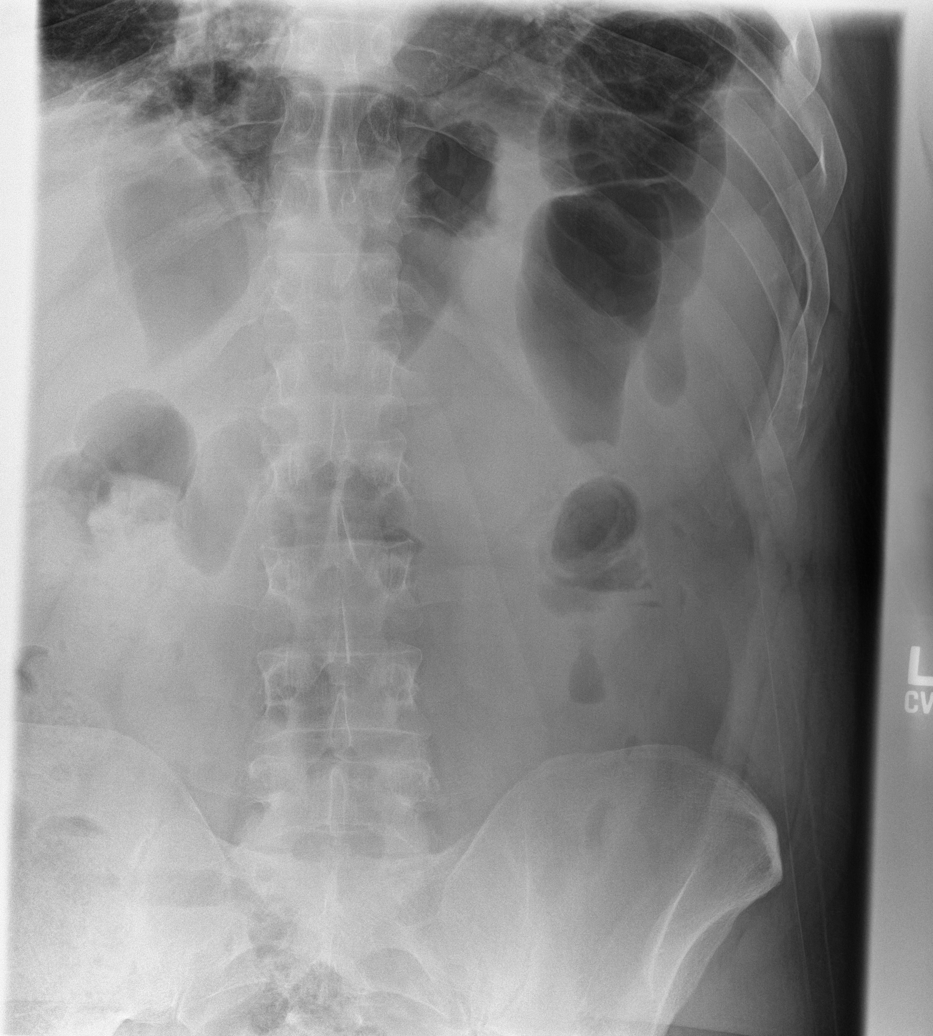
[im 4/4]
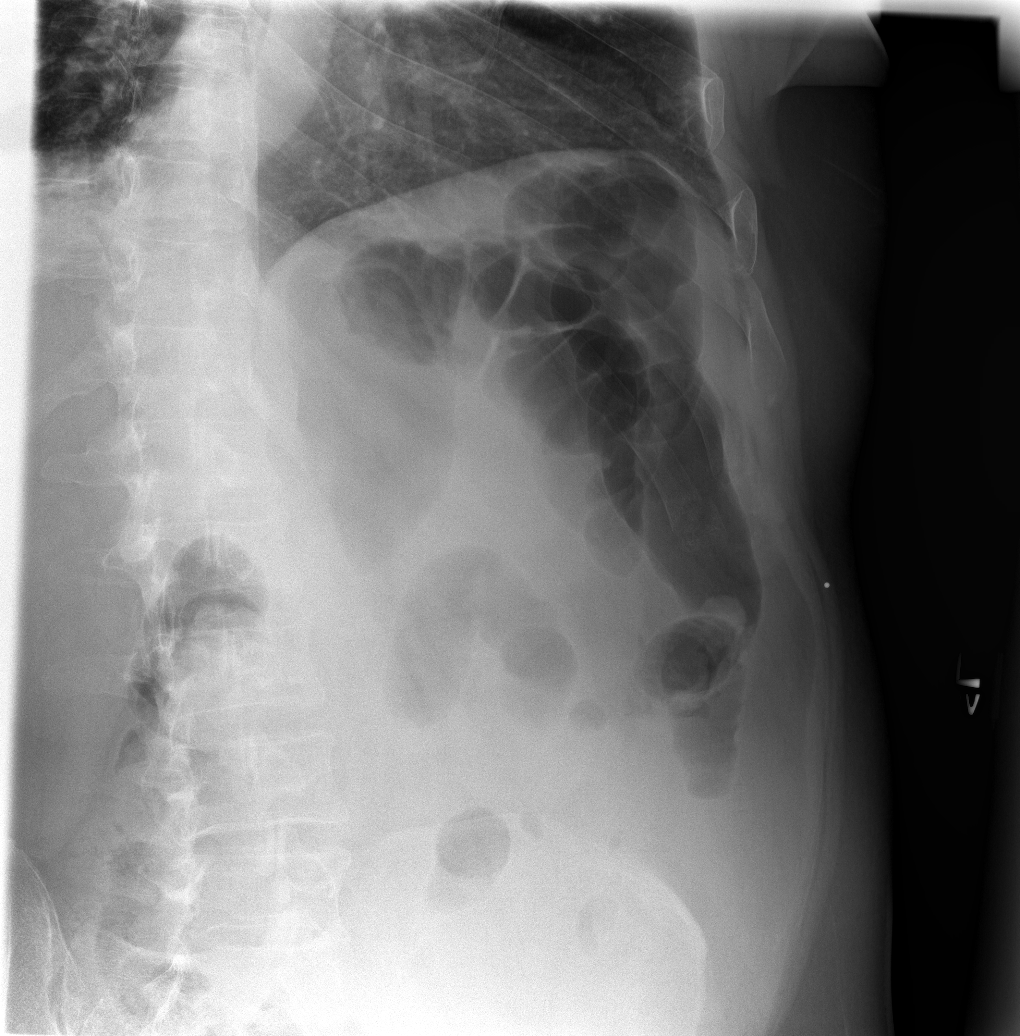

[4 of 4 positions shown; findings below may reference images not displayed]

FINDINGS: Bones appear demineralized.

Mildly displaced fractures of anterior LEFT eighth and ninth ribs.

Questionable nondisplaced fracture lateral LEFT tenth rib.

No additional fracture or bone destruction.
IMPRESSION: Mildly displaced fractures of the anterior LEFT eighth and ninth
ribs with questionable nondisplaced LEFT tenth rib fracture.

## 2016-04-26 DIAGNOSIS — K080 Exfoliation of teeth due to systemic causes: Secondary | ICD-10-CM | POA: Diagnosis not present

## 2016-04-28 DIAGNOSIS — K080 Exfoliation of teeth due to systemic causes: Secondary | ICD-10-CM | POA: Diagnosis not present

## 2016-05-09 ENCOUNTER — Telehealth: Payer: Self-pay | Admitting: Family Medicine

## 2016-05-09 DIAGNOSIS — E785 Hyperlipidemia, unspecified: Secondary | ICD-10-CM

## 2016-05-09 NOTE — Telephone Encounter (Signed)
Pt would like to get a lab slip to have labs done. Pt wasn't sure if he was due for a follow up on medications or if he just needed to have labs done in order to request refills. Last OV: 11/19/15 Please advise. Thanks TNP

## 2016-05-10 NOTE — Telephone Encounter (Signed)
Patient states he will schedule appt next week, please put in order for labs. Thanks. KW

## 2016-05-10 NOTE — Telephone Encounter (Signed)
He is due for follow up o.v.  If he likes he can have lipids & hepatic function panel done a day or two before his appointment.

## 2016-05-10 NOTE — Telephone Encounter (Signed)
Please advise 

## 2016-05-12 DIAGNOSIS — K080 Exfoliation of teeth due to systemic causes: Secondary | ICD-10-CM | POA: Diagnosis not present

## 2016-05-16 ENCOUNTER — Encounter: Payer: Self-pay | Admitting: Family Medicine

## 2016-05-16 ENCOUNTER — Ambulatory Visit (INDEPENDENT_AMBULATORY_CARE_PROVIDER_SITE_OTHER): Payer: Federal, State, Local not specified - PPO | Admitting: Family Medicine

## 2016-05-16 VITALS — BP 126/82 | HR 72 | Temp 98.6°F | Resp 16 | Wt 269.0 lb

## 2016-05-16 DIAGNOSIS — J449 Chronic obstructive pulmonary disease, unspecified: Secondary | ICD-10-CM | POA: Diagnosis not present

## 2016-05-16 DIAGNOSIS — Z720 Tobacco use: Secondary | ICD-10-CM

## 2016-05-16 DIAGNOSIS — E785 Hyperlipidemia, unspecified: Secondary | ICD-10-CM

## 2016-05-16 MED ORDER — FLUTICASONE-SALMETEROL 250-50 MCG/DOSE IN AEPB: 1.0000 | INHALATION_SPRAY | Freq: Two times a day (BID) | RESPIRATORY_TRACT | 4 refills | Status: DC

## 2016-05-16 MED ORDER — MONTELUKAST SODIUM 10 MG PO TABS: 10.0000 mg | ORAL_TABLET | Freq: Every day | ORAL | 4 refills | Status: DC

## 2016-05-16 MED ORDER — BUPROPION HCL ER (SR) 150 MG PO TB12: ORAL_TABLET | ORAL | 3 refills | Status: DC

## 2016-05-16 MED ORDER — ATORVASTATIN CALCIUM 20 MG PO TABS: 20.0000 mg | ORAL_TABLET | Freq: Every day | ORAL | 4 refills | Status: DC

## 2016-05-16 NOTE — Progress Notes (Signed)
Patient: Wayne Gonzalez Male    DOB: 07/25/1970   45 y.o.   MRN: [id number] Visit Date: 05/16/2016  Today's Provider: Mila Merryonald Anurag Scarfo, MD   Chief Complaint  Patient presents with  . Hyperlipidemia  . COPD   Subjective:    HPI  Lipid/Cholesterol, Follow-up:   Last seen for this1 years ago.  Management changes since that visit include no changes. . Last Lipid Panel:    Component Value Date/Time   CHOL 123 04/29/2015 0745   TRIG 108 04/29/2015 0745   HDL 29 (L) 04/29/2015 0745   CHOLHDL 4.2 04/29/2015 0745   LDLCALC 72 04/29/2015 0745    Risk factors for vascular disease include smoking  He reports good compliance with treatment. He is not having side effects.  Current symptoms include none and have been stable. Weight trend: stable Prior visit with dietician: no Current diet: well balanced Current exercise: none  Wt Readings from Last 3 Encounters:  05/16/16 269 lb (122 kg)  11/19/15 265 lb (120.2 kg)  04/28/15 279 lb (126.6 kg)     COPD, follow up:  Patient comes in today for a F/U on COPD. He was last seen for this 1 year ago. Patient reports that he has been stable on his medications, and he is requesting a refill. Is using Advair and singulair daily and rarely requires rescue inhaler.  Patient reports that he is still smoking. Was given rx for bupropion at last ov, but he never filled rx. Doesn't feel like he is ready to quit.     No Known Allergies   Current Outpatient Prescriptions:  .  albuterol (VENTOLIN HFA) 108 (90 Base) MCG/ACT inhaler, Inhale 2 puffs into the lungs every 6 (six) hours as needed for wheezing or shortness of breath., Disp: 1 Inhaler, Rfl: 1 .  atorvastatin (LIPITOR) 20 MG tablet, Take 1 tablet (20 mg total) by mouth daily., Disp: 90 tablet, Rfl: 4 .  fish oil-omega-3 fatty acids 1000 MG capsule, Take 1 g by mouth daily., Disp: , Rfl:  .  fluticasone (FLONASE) 50 MCG/ACT nasal spray, Place 2 sprays into both nostrils daily., Disp:  16 g, Rfl: 3 .  Fluticasone-Salmeterol (ADVAIR) 250-50 MCG/DOSE AEPB, Inhale 1 puff into the lungs every 12 (twelve) hours., Disp: 180 each, Rfl: 4 .  Loratadine (CLARITIN) 10 MG CAPS, Take 1 capsule by mouth daily., Disp: , Rfl:  .  montelukast (SINGULAIR) 10 MG tablet, Take 1 tablet (10 mg total) by mouth at bedtime., Disp: 90 tablet, Rfl: 4 .  Multiple Vitamin (MULTI-VITAMIN DAILY PO), Take 1 tablet by mouth daily., Disp: , Rfl:  .  buPROPion (WELLBUTRIN SR) 150 MG 12 hr tablet, 1 tablet daily for 3 days, then 1 tablet twice daily. Stop smoking 14 days after starting medication, Disp: 60 tablet, Rfl: 3 .  nystatin (MYCOSTATIN) 100000 UNIT/ML suspension, Take 5 mLs (500,000 Units total) by mouth 4 (four) times daily. (Patient not taking: Reported on 05/16/2016), Disp: 60 mL, Rfl: 3  Review of Systems  Constitutional: Negative.   Respiratory: Negative.   Cardiovascular: Negative.   Skin: Negative.   Neurological: Negative.     Social History  Substance Use Topics  . Smoking status: Current Every Day Smoker    Packs/day: 1.00    Years: 22.00    Types: Cigarettes  . Smokeless tobacco: Never Used  . Alcohol use 0.0 oz/week     Comment: Rarely Rarely   Objective:   BP 126/82 (BP Location:  Left Arm, Patient Position: Sitting, Cuff Size: Large)   Pulse 72   Temp 98.6 F (37 C)   Resp 16   Wt 269 lb (122 kg)   SpO2 96%   BMI 33.85 kg/m   Physical Exam  General Appearance:    Alert, cooperative, no distress, obese  Eyes:    PERRL, conjunctiva/corneas clear, EOM's intact       Lungs:     Clear to auscultation bilaterally, respirations unlabored  Heart:    Regular rate and rhythm  Neurologic:   Awake, alert, oriented x 3. No apparent focal neurological           defect.       Spirometry: Mild obstruction, poor quality study. Stable from spirometry of 2014.     Assessment & Plan:     1. Hyperlipidemia, unspecified hyperlipidemia type He is tolerating atorvastatin well with no  adverse effects.   - Lipid panel - Comprehensive metabolic panel - atorvastatin (LIPITOR) 20 MG tablet; Take 1 tablet (20 mg total) by mouth daily.  Dispense: 90 tablet; Refill: 4  2. Tobacco abuse Extensively counseled on long term adverse health effects of smoking, particularly since he has developed COD at such a young age.  - buPROPion (WELLBUTRIN SR) 150 MG 12 hr tablet; 1 tablet daily for 3 days, then 1 tablet twice daily. Stop smoking 14 days after starting medication  Dispense: 60 tablet; Refill: 3  3. Chronic obstructive pulmonary disease, unspecified COPD type (HCC) Doing well on current regiment of Advair and Singulair.  - Spirometry with Graph - montelukast (SINGULAIR) 10 MG tablet; Take 1 tablet (10 mg total) by mouth at bedtime.  Dispense: 90 tablet; Refill: 4 - Fluticasone-Salmeterol (ADVAIR) 250-50 MCG/DOSE AEPB; Inhale 1 puff into the lungs every 12 (twelve) hours.  Dispense: 180 each; Refill: 4       Mila Merryonald Raif Chachere, MD  [facility]

## 2016-05-17 DIAGNOSIS — E785 Hyperlipidemia, unspecified: Secondary | ICD-10-CM | POA: Diagnosis not present

## 2016-05-18 LAB — LIPID PANEL
CHOLESTEROL TOTAL: 140 mg/dL (ref 100–199)
Chol/HDL Ratio: 4.2 ratio units (ref 0.0–5.0)
HDL: 33 mg/dL — AB (ref >39)
LDL Calculated: 86 mg/dL (ref 0–99)
TRIGLYCERIDES: 105 mg/dL (ref 0–149)
VLDL Cholesterol Cal: 21 mg/dL (ref 5–40)

## 2016-05-18 LAB — COMPREHENSIVE METABOLIC PANEL
A/G RATIO: 1.6 (ref 1.2–2.2)
ALT: 35 IU/L (ref 0–44)
AST: 33 IU/L (ref 0–40)
Albumin: 4.1 g/dL (ref 3.5–5.5)
Alkaline Phosphatase: 62 IU/L (ref 39–117)
BILIRUBIN TOTAL: 0.8 mg/dL (ref 0.0–1.2)
BUN / CREAT RATIO: 12 (ref 9–20)
BUN: 11 mg/dL (ref 6–24)
CALCIUM: 9.3 mg/dL (ref 8.7–10.2)
CO2: 24 mmol/L (ref 18–29)
Chloride: 103 mmol/L (ref 96–106)
Creatinine, Ser: 0.92 mg/dL (ref 0.76–1.27)
GFR, EST AFRICAN AMERICAN: 117 mL/min/{1.73_m2} (ref >59)
GFR, EST NON AFRICAN AMERICAN: 101 mL/min/{1.73_m2} (ref >59)
GLOBULIN, TOTAL: 2.6 g/dL (ref 1.5–4.5)
Glucose: 82 mg/dL (ref 65–99)
POTASSIUM: 4.5 mmol/L (ref 3.5–5.2)
Sodium: 143 mmol/L (ref 134–144)
TOTAL PROTEIN: 6.7 g/dL (ref 6.0–8.5)

## 2016-05-22 ENCOUNTER — Telehealth: Payer: Self-pay

## 2016-05-22 NOTE — Telephone Encounter (Signed)
Advised patient as below.  

## 2016-05-22 NOTE — Telephone Encounter (Signed)
Left message to call back  

## 2016-05-22 NOTE — Telephone Encounter (Signed)
-----   Message from Malva Limesonald E Fisher, MD sent at 05/22/2016  9:05 AM EST ----- Cholesterol is well controlled at 140. Continue current medications.  Check labs yearly.

## 2016-07-24 DIAGNOSIS — K080 Exfoliation of teeth due to systemic causes: Secondary | ICD-10-CM | POA: Diagnosis not present

## 2016-08-21 DIAGNOSIS — K080 Exfoliation of teeth due to systemic causes: Secondary | ICD-10-CM | POA: Diagnosis not present

## 2016-08-30 DIAGNOSIS — K080 Exfoliation of teeth due to systemic causes: Secondary | ICD-10-CM | POA: Diagnosis not present

## 2016-10-13 DIAGNOSIS — K080 Exfoliation of teeth due to systemic causes: Secondary | ICD-10-CM | POA: Diagnosis not present

## 2016-11-03 ENCOUNTER — Ambulatory Visit
Admission: RE | Admit: 2016-11-03 | Discharge: 2016-11-03 | Disposition: A | Discharge status: Home / Self Care | Payer: Federal, State, Local not specified - PPO | Source: Ambulatory Visit | Attending: Family Medicine | Admitting: Family Medicine

## 2016-11-03 ENCOUNTER — Encounter: Payer: Self-pay | Admitting: Family Medicine

## 2016-11-03 ENCOUNTER — Ambulatory Visit (INDEPENDENT_AMBULATORY_CARE_PROVIDER_SITE_OTHER): Payer: Federal, State, Local not specified - PPO | Admitting: Family Medicine

## 2016-11-03 VITALS — BP 122/88 | HR 93 | Temp 99.7°F | Resp 24 | Wt 270.0 lb

## 2016-11-03 DIAGNOSIS — R05 Cough: Secondary | ICD-10-CM

## 2016-11-03 DIAGNOSIS — J189 Pneumonia, unspecified organism: Secondary | ICD-10-CM

## 2016-11-03 DIAGNOSIS — R059 Cough, unspecified: Secondary | ICD-10-CM

## 2016-11-03 DIAGNOSIS — R918 Other nonspecific abnormal finding of lung field: Secondary | ICD-10-CM | POA: Insufficient documentation

## 2016-11-03 DIAGNOSIS — J181 Lobar pneumonia, unspecified organism: Secondary | ICD-10-CM

## 2016-11-03 DIAGNOSIS — R0602 Shortness of breath: Secondary | ICD-10-CM | POA: Diagnosis not present

## 2016-11-03 MED ORDER — LEVOFLOXACIN 750 MG PO TABS: 750.0000 mg | ORAL_TABLET | Freq: Every day | ORAL | 0 refills | Status: AC

## 2016-11-03 NOTE — Patient Instructions (Signed)
Go to the [facility] on [address] for chest Xray  

## 2016-11-03 NOTE — Progress Notes (Signed)
Patient: Wayne Gonzalez Male    DOB: [birth year]   46 y.o.   MRN: [id number] Visit Date: 11/03/2016  Today's Provider: Mila Merry, MD   Chief Complaint  Patient presents with  . Cough   Subjective:    Cough  This is a new problem. Episode onset: 9 days ago. The problem has been gradually worsening. The cough is productive of sputum (with milky yellow colored sputum). Associated symptoms include chest pain (when coughing), chills, a fever (up to 102.8 last night), myalgias (in legs), a sore throat, shortness of breath, sweats and wheezing. Pertinent negatives include no ear congestion, ear pain, headaches, heartburn, hemoptysis, nasal congestion or postnasal drip. He has tried steroid inhaler (Aspirin and Flonase) for the symptoms. The treatment provided mild relief.       No Known Allergies   Current Outpatient Prescriptions:  .  albuterol (VENTOLIN HFA) 108 (90 Base) MCG/ACT inhaler, Inhale 2 puffs into the lungs every 6 (six) hours as needed for wheezing or shortness of breath., Disp: 1 Inhaler, Rfl: 1 .  atorvastatin (LIPITOR) 20 MG tablet, Take 1 tablet (20 mg total) by mouth daily., Disp: 90 tablet, Rfl: 4 .  fish oil-omega-3 fatty acids 1000 MG capsule, Take 1 g by mouth daily., Disp: , Rfl:  .  fluticasone (FLONASE) 50 MCG/ACT nasal spray, Place 2 sprays into both nostrils daily., Disp: 16 g, Rfl: 3 .  Fluticasone-Salmeterol (ADVAIR) 250-50 MCG/DOSE AEPB, Inhale 1 puff into the lungs every 12 (twelve) hours., Disp: 180 each, Rfl: 4 .  Loratadine (CLARITIN) 10 MG CAPS, Take 1 capsule by mouth daily., Disp: , Rfl:  .  montelukast (SINGULAIR) 10 MG tablet, Take 1 tablet (10 mg total) by mouth at bedtime., Disp: 90 tablet, Rfl: 4 .  Multiple Vitamin (MULTI-VITAMIN DAILY PO), Take 1 tablet by mouth daily., Disp: , Rfl:   Review of Systems  Constitutional: Positive for chills and fever (up to 102.8 last night). Negative for appetite change.  HENT: Positive for congestion  (head and chest congestion), sneezing, sore throat and voice change. Negative for ear pain, nosebleeds, postnasal drip, sinus pain and sinus pressure.   Respiratory: Positive for cough, shortness of breath and wheezing. Negative for hemoptysis and chest tightness.   Cardiovascular: Positive for chest pain (when coughing). Negative for palpitations.  Gastrointestinal: Negative for abdominal pain, heartburn, nausea and vomiting.  Musculoskeletal: Positive for myalgias (in legs).  Neurological: Negative for headaches.    Social History  Substance Use Topics  . Smoking status: Current Every Day Smoker    Packs/day: 1.00    Years: 22.00    Types: Cigarettes  . Smokeless tobacco: Never Used  . Alcohol use 0.0 oz/week     Comment: Rarely Rarely   Objective:   BP 122/88 (BP Location: Right Arm, Patient Position: Sitting, Cuff Size: Large)   Pulse 93   Temp 99.7 F (37.6 C) (Oral)   Resp (!) 24   Wt 270 lb (122.5 kg)   SpO2 91% Comment: room air  BMI 33.97 kg/m  Vitals:   11/03/16 1037  Resp: (!) 24  Weight: 270 lb (122.5 kg)     Physical Exam   General Appearance:    Alert, cooperative, no distress  Eyes:    PERRL, conjunctiva/corneas clear, EOM's intact       Lungs:     Faint bibasilar rales, diffuse expiratory rhonchi  Heart:    Regular rate and rhythm  Neurologic:   Awake, alert,  oriented x 3. No apparent focal neurological           defect.       Dg Chest 2 View  Result Date: 11/03/2016 CLINICAL DATA:  Fever, productive cough. EXAM: CHEST  2 VIEW COMPARISON:  Radiographs of January 13, 2015. FINDINGS: The heart size and mediastinal contours are within normal limits. No pneumothorax or pleural effusion is noted. Minimal left basilar subsegmental atelectasis is noted. Mild right basilar atelectasis or pneumonia is noted. The visualized skeletal structures are unremarkable. IMPRESSION: Mild right basilar opacity concerning for atelectasis or pneumonia. Followup PA and lateral  chest X-ray is recommended in 3-4 weeks following trial of antibiotic therapy to ensure resolution and exclude underlying malignancy. Electronically Signed   By: Lupita RaiderJames  Green Jr, M.D.   On: 11/03/2016 12:49       Assessment & Plan:     1. Cough  - DG Chest 2 View; Future  2. Pneumonia of right lower lobe due to infectious organism (HCC)  - levofloxacin (LEVAQUIN) 750 MG tablet; Take 1 tablet (750 mg total) by mouth daily.  Dispense: 10 tablet; Refill: 0   Call if symptoms change or if not rapidly improving.          Mila Merryonald Breckon Reeves, MD  [facility]

## 2016-11-24 ENCOUNTER — Telehealth: Payer: Self-pay | Admitting: Family Medicine

## 2016-11-24 DIAGNOSIS — R918 Other nonspecific abnormal finding of lung field: Secondary | ICD-10-CM

## 2016-11-24 NOTE — Telephone Encounter (Signed)
Patient advised and order placed. Patient will not be able to get this done till Wednesday of next week 11/29/16 due to been out of town-aa

## 2016-11-24 NOTE — Telephone Encounter (Signed)
Patient needs follow up chest xray to ensure clearing of infiltrate on xray from 11-03-16. Please advise and enter order. Thanks.

## 2016-11-29 ENCOUNTER — Ambulatory Visit
Admission: RE | Admit: 2016-11-29 | Discharge: 2016-11-29 | Disposition: A | Discharge status: Home / Self Care | Payer: Federal, State, Local not specified - PPO | Source: Ambulatory Visit | Attending: Family Medicine | Admitting: Family Medicine

## 2016-11-29 ENCOUNTER — Telehealth: Payer: Self-pay

## 2016-11-29 DIAGNOSIS — J189 Pneumonia, unspecified organism: Secondary | ICD-10-CM | POA: Diagnosis not present

## 2016-11-29 DIAGNOSIS — R918 Other nonspecific abnormal finding of lung field: Secondary | ICD-10-CM

## 2016-11-29 NOTE — Telephone Encounter (Signed)
-----   Message from Malva Limesonald E Fisher, MD sent at 11/29/2016 10:53 AM EDT ----- Lungs have cleared. No additional follow ups needed.

## 2016-11-29 NOTE — Telephone Encounter (Signed)
Advised patient as below. Patient is also requesting that we send in the Ventolin inhaler into the mail order pharmacy. Thanks!

## 2016-11-30 MED ORDER — ALBUTEROL SULFATE HFA 108 (90 BASE) MCG/ACT IN AERS: 2.0000 | INHALATION_SPRAY | Freq: Four times a day (QID) | RESPIRATORY_TRACT | 1 refills | Status: DC | PRN

## 2017-02-13 DIAGNOSIS — K080 Exfoliation of teeth due to systemic causes: Secondary | ICD-10-CM | POA: Diagnosis not present

## 2017-02-23 DIAGNOSIS — K080 Exfoliation of teeth due to systemic causes: Secondary | ICD-10-CM | POA: Diagnosis not present

## 2017-03-10 DIAGNOSIS — K080 Exfoliation of teeth due to systemic causes: Secondary | ICD-10-CM | POA: Diagnosis not present

## 2017-05-11 ENCOUNTER — Encounter: Payer: Self-pay | Admitting: Family Medicine

## 2017-05-11 ENCOUNTER — Ambulatory Visit: Payer: Federal, State, Local not specified - PPO | Admitting: Family Medicine

## 2017-05-11 VITALS — BP 138/98 | HR 77 | Temp 98.6°F | Resp 16 | Wt 275.0 lb

## 2017-05-11 DIAGNOSIS — Z720 Tobacco use: Secondary | ICD-10-CM

## 2017-05-11 DIAGNOSIS — E785 Hyperlipidemia, unspecified: Secondary | ICD-10-CM

## 2017-05-11 DIAGNOSIS — J449 Chronic obstructive pulmonary disease, unspecified: Secondary | ICD-10-CM | POA: Diagnosis not present

## 2017-05-11 LAB — COMPLETE METABOLIC PANEL WITH GFR
AG Ratio: 1.5 (calc) (ref 1.0–2.5)
ALT: 29 U/L (ref 9–46)
AST: 28 U/L (ref 10–40)
Albumin: 4 g/dL (ref 3.6–5.1)
Alkaline phosphatase (APISO): 58 U/L (ref 40–115)
BUN: 10 mg/dL (ref 7–25)
CO2: 24 mmol/L (ref 20–32)
CREATININE: 0.86 mg/dL (ref 0.60–1.35)
Calcium: 9.1 mg/dL (ref 8.6–10.3)
Chloride: 109 mmol/L (ref 98–110)
GFR, EST NON AFRICAN AMERICAN: 105 mL/min/{1.73_m2} (ref >=60)
GFR, Est African American: 121 mL/min/{1.73_m2} (ref >=60)
Globulin: 2.6 g/dL (calc) (ref 1.9–3.7)
Glucose, Bld: 93 mg/dL (ref 65–99)
Potassium: 3.9 mmol/L (ref 3.5–5.3)
Sodium: 139 mmol/L (ref 135–146)
Total Bilirubin: 0.6 mg/dL (ref 0.2–1.2)
Total Protein: 6.6 g/dL (ref 6.1–8.1)

## 2017-05-11 LAB — LIPID PANEL
CHOL/HDL RATIO: 3.9 (calc) (ref <5.0)
Cholesterol: 135 mg/dL (ref <200)
HDL: 35 mg/dL — ABNORMAL LOW (ref >40)
LDL CHOLESTEROL (CALC): 82 mg/dL
NON-HDL CHOLESTEROL (CALC): 100 mg/dL (ref <130)
TRIGLYCERIDES: 86 mg/dL (ref <150)

## 2017-05-11 MED ORDER — BUPROPION HCL ER (SR) 150 MG PO TB12: ORAL_TABLET | ORAL | 3 refills | Status: DC

## 2017-05-11 NOTE — Progress Notes (Signed)
Patient: Wayne Gonzalez Male    DOB: 07/18/1970   46 y.o.   MRN: [id number] Visit Date: 05/11/2017  Today's Provider: Mila Merryonald Derrius Furtick, MD   Chief Complaint  Patient presents with  . COPD    follow up  . Hyperlipidemia    follow up  . Nicotine Dependence   Subjective:    HPI Follow up of COPD:  Patient was last seen for this problem 1 year ago and no changes were made. Patient reports good compliance with treatment, good tolerance and symptom control. He is using Advair twice every day and albuterol 2-3 times most days and finds it effective.   Follow up of Tobacco use:  Patient was last seen for this problem 1 year ago and was prescribed buproprion. Today patient comes in reporting that he never started the bupropion due to concern regarding potential adverse effects. . He still smokes 0.5-1 ppd. Patient states his interest in smoking cessation is low-medium.    Lipid/Cholesterol, Follow-up:   Last seen for this 1 years ago.  Management changes since that visit include none. . Last Lipid Panel:    Component Value Date/Time   CHOL 140 05/17/2016 0823   TRIG 105 05/17/2016 0823   HDL 33 (L) 05/17/2016 0823   CHOLHDL 4.2 05/17/2016 0823   LDLCALC 86 05/17/2016 0823    Risk factors for vascular disease include hypercholesterolemia  He reports good compliance with treatment. He is not having side effects.  Current symptoms include none and have been stable. Weight trend: increasing steadily Prior visit with dietician: no Current diet: in general, an "unhealthy" diet Current exercise: none  Wt Readings from Last 3 Encounters:  11/03/16 270 lb (122.5 kg)  05/16/16 269 lb (122 kg)  11/19/15 265 lb (120.2 kg)    -------------------------------------------------------------------      No Known Allergies   Current Outpatient Medications:  .  albuterol (VENTOLIN HFA) 108 (90 Base) MCG/ACT inhaler, Inhale 2 puffs into the lungs every 6 (six) hours as needed for  wheezing or shortness of breath., Disp: 3 Inhaler, Rfl: 1 .  atorvastatin (LIPITOR) 20 MG tablet, Take 1 tablet (20 mg total) by mouth daily., Disp: 90 tablet, Rfl: 4 .  fish oil-omega-3 fatty acids 1000 MG capsule, Take 1 g by mouth daily., Disp: , Rfl:  .  fluticasone (FLONASE) 50 MCG/ACT nasal spray, Place 2 sprays into both nostrils daily., Disp: 16 g, Rfl: 3 .  Fluticasone-Salmeterol (ADVAIR) 250-50 MCG/DOSE AEPB, Inhale 1 puff into the lungs every 12 (twelve) hours., Disp: 180 each, Rfl: 4 .  Loratadine (CLARITIN) 10 MG CAPS, Take 1 capsule by mouth daily., Disp: , Rfl:  .  montelukast (SINGULAIR) 10 MG tablet, Take 1 tablet (10 mg total) by mouth at bedtime., Disp: 90 tablet, Rfl: 4 .  Multiple Vitamin (MULTI-VITAMIN DAILY PO), Take 1 tablet by mouth daily., Disp: , Rfl:   Review of Systems  Constitutional: Negative for appetite change, chills and fever.  Respiratory: Negative for chest tightness, shortness of breath and wheezing.   Cardiovascular: Negative for chest pain and palpitations.  Gastrointestinal: Negative for abdominal pain, nausea and vomiting.    Social History   Tobacco Use  . Smoking status: Current Every Day Smoker    Packs/day: 1.00    Years: 22.00    Pack years: 22.00    Types: Cigarettes  . Smokeless tobacco: Never Used  Substance Use Topics  . Alcohol use: Yes    Alcohol/week: 0.0 oz  Comment: Rarely Rarely   Objective:   BP (!) 138/98 (BP Location: Right Arm, Cuff Size: Large)   Pulse 77   Temp 98.6 F (37 C) (Oral)   Resp 16   Wt 275 lb (124.7 kg)   SpO2 95% Comment: room air  BMI 34.60 kg/m  Vitals:   05/11/17 0836 05/11/17 0838  BP: (!) 142/100 (!) 138/98  Pulse: 77   Resp: 16   Temp: 98.6 F (37 C)   TempSrc: Oral   SpO2: 95%   Weight: 275 lb (124.7 kg)      Physical Exam   General Appearance:    Alert, cooperative, no distress  Eyes:    PERRL, conjunctiva/corneas clear, EOM's intact       Lungs:     Clear to auscultation  bilaterally, respirations unlabored, diminished breath sounds.   Heart:    Regular rate and rhythm  Neurologic:   Awake, alert, oriented x 3. No apparent focal neurological           defect.           Assessment & Plan:     1. Hyperlipidemia, unspecified hyperlipidemia type He is tolerating atorvastatin well with no adverse effects.   - COMPLETE METABOLIC PANEL WITH GFR - Lipid panel  2. Tobacco abuse  - buPROPion (WELLBUTRIN SR) 150 MG 12 hr tablet; 1 tablet daily for 3 days, then 1 tablet twice daily. Stop smoking 14 days after starting medication  Dispense: 60 tablet; Refill: 3  3. Chronic obstructive pulmonary disease, unspecified COPD type (HCC)  Counseled vital importance of smoking cessation. Continue current inhalers.        Mila Merryonald Raenette Sakata, MD  [facility]

## 2017-05-11 NOTE — Patient Instructions (Signed)
Steps to Quit Smoking Smoking tobacco can be bad for your health. It can also affect almost every organ in your body. Smoking puts you and people around you at risk for many serious long-lasting (chronic) diseases. Quitting smoking is hard, but it is one of the best things that you can do for your health. It is never too late to quit. What are the benefits of quitting smoking? When you quit smoking, you lower your risk for getting serious diseases and conditions. They can include:  Lung cancer or lung disease.  Heart disease.  Stroke.  Heart attack.  Not being able to have children (infertility).  Weak bones (osteoporosis) and broken bones (fractures).  If you have coughing, wheezing, and shortness of breath, those symptoms may get better when you quit. You may also get sick less often. If you are pregnant, quitting smoking can help to lower your chances of having a baby of low birth weight. What can I do to help me quit smoking? Talk with your doctor about what can help you quit smoking. Some things you can do (strategies) include:  Quitting smoking totally, instead of slowly cutting back how much you smoke over a period of time.  Going to in-person counseling. You are more likely to quit if you go to many counseling sessions.  Using resources and support systems, such as: ? Online chats with a counselor. ? Phone quitlines. ? Printed self-help materials. ? Support groups or group counseling. ? Text messaging programs. ? Mobile phone apps or applications.  Taking medicines. Some of these medicines may have nicotine in them. If you are pregnant or breastfeeding, do not take any medicines to quit smoking unless your doctor says it is okay. Talk with your doctor about counseling or other things that can help you.  Talk with your doctor about using more than one strategy at the same time, such as taking medicines while you are also going to in-person counseling. This can help make  quitting easier. What things can I do to make it easier to quit? Quitting smoking might feel very hard at first, but there is a lot that you can do to make it easier. Take these steps:  Talk to your family and friends. Ask them to support and encourage you.  Call phone quitlines, reach out to support groups, or work with a counselor.  Ask people who smoke to not smoke around you.  Avoid places that make you want (trigger) to smoke, such as: ? Bars. ? Parties. ? Smoke-break areas at work.  Spend time with people who do not smoke.  Lower the stress in your life. Stress can make you want to smoke. Try these things to help your stress: ? Getting regular exercise. ? Deep-breathing exercises. ? Yoga. ? Meditating. ? Doing a body scan. To do this, close your eyes, focus on one area of your body at a time from head to toe, and notice which parts of your body are tense. Try to relax the muscles in those areas.  Download or buy apps on your mobile phone or tablet that can help you stick to your quit plan. There are many free apps, such as QuitGuide from the CDC (Centers for Disease Control and Prevention). You can find more support from smokefree.gov and other websites.  This information is not intended to replace advice given to you by your health care provider. Make sure you discuss any questions you have with your health care provider. Document Released: 04/08/2009 Document   Revised: 02/08/2016 Document Reviewed: 10/27/2014 Elsevier Interactive Patient Education  2018 Elsevier Inc.  

## 2017-05-14 ENCOUNTER — Other Ambulatory Visit: Payer: Self-pay

## 2017-05-14 DIAGNOSIS — J449 Chronic obstructive pulmonary disease, unspecified: Secondary | ICD-10-CM

## 2017-05-14 DIAGNOSIS — E785 Hyperlipidemia, unspecified: Secondary | ICD-10-CM

## 2017-05-14 MED ORDER — MONTELUKAST SODIUM 10 MG PO TABS: 10.0000 mg | ORAL_TABLET | Freq: Every day | ORAL | 4 refills | Status: AC

## 2017-05-14 MED ORDER — ALBUTEROL SULFATE HFA 108 (90 BASE) MCG/ACT IN AERS: 2.0000 | INHALATION_SPRAY | Freq: Four times a day (QID) | RESPIRATORY_TRACT | 1 refills | Status: AC | PRN

## 2017-05-14 MED ORDER — FLUTICASONE PROPIONATE 50 MCG/ACT NA SUSP: 2.0000 | Freq: Every day | NASAL | 3 refills | Status: AC

## 2017-05-14 MED ORDER — ATORVASTATIN CALCIUM 20 MG PO TABS: 20.0000 mg | ORAL_TABLET | Freq: Every day | ORAL | 4 refills | Status: AC

## 2017-05-14 MED ORDER — FLUTICASONE-SALMETEROL 250-50 MCG/DOSE IN AEPB: 1.0000 | INHALATION_SPRAY | Freq: Two times a day (BID) | RESPIRATORY_TRACT | 4 refills | Status: AC

## 2017-05-14 NOTE — Telephone Encounter (Signed)
Advised patient of results. Patient is requesting that all of his medication be sent into mail order pharmacy. Please review. Thanks!

## 2017-05-14 NOTE — Telephone Encounter (Signed)
-----   Message from Malva Limesonald E Fisher, MD sent at 05/12/2017  7:55 PM EST ----- Labs are good. Cholesterol well controlled at 135. Continue current medications.  Check yearly

## 2017-08-13 ENCOUNTER — Telehealth: Payer: Self-pay | Admitting: Family Medicine

## 2017-08-13 NOTE — Telephone Encounter (Signed)
Pt's wife states her husband was exposed to somone who tested positiv e for the flu.  Pt's wife states he has COPD and has had his flu shot. Pt's wife wants to know if there is anything else they need to do to protect/prevent him for getting the flu.   Pt has no symptoms at this time.

## 2017-08-13 NOTE — Telephone Encounter (Signed)
Wife has been advised. KW 

## 2017-08-13 NOTE — Telephone Encounter (Signed)
Please advise 

## 2017-08-13 NOTE — Telephone Encounter (Signed)
Should be fine since he had flu shot. Call if he develops any fevers, shortness of breath or sore throat within then next 5 days.

## 2017-09-07 ENCOUNTER — Ambulatory Visit (INDEPENDENT_AMBULATORY_CARE_PROVIDER_SITE_OTHER): Payer: Federal, State, Local not specified - PPO | Admitting: Family Medicine

## 2017-09-07 ENCOUNTER — Encounter: Payer: Self-pay | Admitting: Family Medicine

## 2017-09-07 VITALS — BP 134/94 | HR 94 | Temp 98.5°F | Resp 16

## 2017-09-07 DIAGNOSIS — J40 Bronchitis, not specified as acute or chronic: Secondary | ICD-10-CM | POA: Diagnosis not present

## 2017-09-07 MED ORDER — CEFDINIR 300 MG PO CAPS: 600.0000 mg | ORAL_CAPSULE | Freq: Every day | ORAL | 0 refills | Status: AC

## 2017-09-07 NOTE — Patient Instructions (Signed)
   Start using your Advair inhaler twice a day every day until your cough is better. You can still use the albuterol inhaler as needed.

## 2017-09-07 NOTE — Progress Notes (Signed)
Patient: Wayne Gonzalez Male    DOB: 06/20/1971   47 y.o.   MRN: [id number] Visit Date: 09/07/2017  Today's Provider: Mila Merryonald Zubin Pontillo, MD   Chief Complaint  Patient presents with  . Cough   Subjective:    Patient has had a cough since December 2018. Patient states cough is productive. Patient states that 2 days nights ago cough changed with symptoms of chills and he felt like he was running a fever. Cough seems worse when he is laying down. Has not taken any otc medications for symptoms.    Cough  This is a chronic problem. The current episode started more than 1 month ago (3 months). The cough is productive of sputum. Associated symptoms include chills, a fever, headaches, shortness of breath and wheezing. Pertinent negatives include no chest pain, ear congestion, ear pain, heartburn, hemoptysis, myalgias, nasal congestion, postnasal drip, rash, rhinorrhea, sore throat, sweats or weight loss. The symptoms are aggravated by lying down. Risk factors for lung disease include smoking/tobacco exposure. His past medical history is significant for bronchitis, COPD, environmental allergies and pneumonia. There is no history of asthma, bronchiectasis or emphysema.  he does have a history of pneumonia last year.He has a chronic smokers cough which he states returned to baseline after pneumonia was treated, but has worsened as above in December.      No Known Allergies   Current Outpatient Medications:  .  albuterol (VENTOLIN HFA) 108 (90 Base) MCG/ACT inhaler, Inhale 2 puffs every 6 (six) hours as needed into the lungs for wheezing or shortness of breath., Disp: 3 Inhaler, Rfl: 1 .  atorvastatin (LIPITOR) 20 MG tablet, Take 1 tablet (20 mg total) daily by mouth., Disp: 90 tablet, Rfl: 4 .  fish oil-omega-3 fatty acids 1000 MG capsule, Take 1 g by mouth daily., Disp: , Rfl:  .  fluticasone (FLONASE) 50 MCG/ACT nasal spray, Place 2 sprays daily into both nostrils., Disp: 42 g, Rfl: 3 .   Fluticasone-Salmeterol (ADVAIR) 250-50 MCG/DOSE AEPB, Inhale 1 puff every 12 (twelve) hours into the lungs., Disp: 180 each, Rfl: 4 .  Loratadine (CLARITIN) 10 MG CAPS, Take 1 capsule by mouth daily., Disp: , Rfl:  .  montelukast (SINGULAIR) 10 MG tablet, Take 1 tablet (10 mg total) at bedtime by mouth., Disp: 90 tablet, Rfl: 4 .  Multiple Vitamin (MULTI-VITAMIN DAILY PO), Take 1 tablet by mouth daily., Disp: , Rfl:  .  buPROPion (WELLBUTRIN SR) 150 MG 12 hr tablet, 1 tablet daily for 3 days, then 1 tablet twice daily. Stop smoking 14 days after starting medication (Patient not taking: Reported on 09/07/2017), Disp: 60 tablet, Rfl: 3  Review of Systems  Constitutional: Positive for chills and fever. Negative for appetite change and weight loss.  HENT: Positive for congestion. Negative for ear pain, postnasal drip, rhinorrhea and sore throat.   Respiratory: Positive for cough, shortness of breath and wheezing. Negative for hemoptysis and chest tightness.   Cardiovascular: Negative for chest pain and palpitations.  Gastrointestinal: Negative for abdominal pain, heartburn, nausea and vomiting.  Musculoskeletal: Negative for myalgias.  Skin: Negative for rash.  Allergic/Immunologic: Positive for environmental allergies.  Neurological: Positive for headaches.    Social History   Tobacco Use  . Smoking status: Current Every Day Smoker    Packs/day: 1.00    Years: 22.00    Pack years: 22.00    Types: Cigarettes  . Smokeless tobacco: Never Used  Substance Use Topics  . Alcohol use:  Yes    Alcohol/week: 0.0 oz    Comment: Rarely Rarely   Objective:   BP (!) 134/94 (BP Location: Right Arm, Patient Position: Sitting, Cuff Size: Large)   Pulse 94   Temp 98.5 F (36.9 C) (Oral)   Resp 16   SpO2 93%  Vitals:   09/07/17 1521  BP: (!) 134/94  Pulse: 94  Resp: 16  Temp: 98.5 F (36.9 C)  TempSrc: Oral  SpO2: 93%     Physical Exam  General Appearance:    Alert, cooperative, no  distress  HENT:   ENT exam normal, no neck nodes or sinus tenderness  Eyes:    PERRL, conjunctiva/corneas clear, EOM's intact       Lungs:     Rare expiratory wheeze. Clear to auscultation bilaterally, respirations unlabored  Heart:    Regular rate and rhythm  Neurologic:   Awake, alert, oriented x 3. No apparent focal neurological           defect.           Assessment & Plan:     1. Bronchitis  - cefdinir (OMNICEF) 300 MG capsule; Take 2 capsules (600 mg total) by mouth daily for 10 days.  Dispense: 20 capsule; Refill: 0  Call if symptoms change or if not rapidly improving.          Mila Merry, MD  [facility]

## 2017-09-16 ENCOUNTER — Emergency Department
Admission: EM | Admit: 2017-09-16 | Discharge: 2017-09-24 | Disposition: E | Payer: Federal, State, Local not specified - PPO | Attending: Emergency Medicine | Admitting: Emergency Medicine

## 2017-09-16 DIAGNOSIS — Z79899 Other long term (current) drug therapy: Secondary | ICD-10-CM | POA: Insufficient documentation

## 2017-09-16 DIAGNOSIS — R001 Bradycardia, unspecified: Secondary | ICD-10-CM | POA: Diagnosis not present

## 2017-09-16 DIAGNOSIS — R092 Respiratory arrest: Secondary | ICD-10-CM | POA: Diagnosis not present

## 2017-09-16 DIAGNOSIS — I469 Cardiac arrest, cause unspecified: Secondary | ICD-10-CM | POA: Insufficient documentation

## 2017-09-16 DIAGNOSIS — F17210 Nicotine dependence, cigarettes, uncomplicated: Secondary | ICD-10-CM | POA: Diagnosis not present

## 2017-09-16 DIAGNOSIS — J449 Chronic obstructive pulmonary disease, unspecified: Secondary | ICD-10-CM | POA: Insufficient documentation

## 2017-09-17 ENCOUNTER — Encounter: Payer: Self-pay | Admitting: Emergency Medicine

## 2017-09-17 ENCOUNTER — Other Ambulatory Visit: Payer: Self-pay

## 2017-09-17 MED ORDER — CALCIUM CHLORIDE 10 % IV SOLN
INTRAVENOUS | Status: AC | PRN
  Administered 2017-09-17: 1 g via INTRAVENOUS

## 2017-09-17 MED ORDER — SODIUM BICARBONATE 8.4 % IV SOLN
INTRAVENOUS | Status: AC | PRN
  Administered 2017-09-17 (×2): 100 meq via INTRAVENOUS

## 2017-09-17 MED ORDER — EPINEPHRINE PF 1 MG/10ML IJ SOSY
PREFILLED_SYRINGE | INTRAMUSCULAR | Status: AC | PRN
  Administered 2017-09-17 (×7): 1 via INTRAVENOUS

## 2017-09-17 MED ORDER — ATROPINE SULFATE 1 MG/ML IJ SOLN
INTRAMUSCULAR | Status: AC | PRN
  Administered 2017-09-17: 1 mg via INTRAVENOUS

## 2017-09-17 MED FILL — Medication: Qty: 2 | Status: AC

## 2017-09-19 ENCOUNTER — Telehealth: Payer: Self-pay

## 2017-09-19 NOTE — Telephone Encounter (Signed)
Joni Reiningicole from [facility] had called to inquire about medical information form faxed over on 09/18/17. She states that this needs to be filled out by physician and faxed back. Joni Reiningicole is requesting a call back from nurse or provider with update.  CB [phone] ext [phone]

## 2017-09-19 NOTE — Telephone Encounter (Signed)
Rayfield CitizenCaroline, have you seen this fax yet?

## 2017-09-20 NOTE — Telephone Encounter (Signed)
Christy wth [facility] called following up on the call from yesterday.  Their call back is [phone]  Thanks Barth Kirksteri

## 2017-09-21 ENCOUNTER — Telehealth: Payer: Self-pay | Admitting: Family Medicine

## 2017-09-21 NOTE — Telephone Encounter (Signed)
OK, you can just mark no on the ones I could not answer. Thanks.

## 2017-09-21 NOTE — Telephone Encounter (Signed)
Wayne Gonzalez with [facility] [phone] donor risk assessment form that is blank and though out the medical questions that were not answered.  States the questions need to be answered to the best of your knowledge and everything must be completed as yes or no and not unknown.  Donors organs can not be used unless the entire form is completed.    Re-fax the entire form once the forms are completed.

## 2017-09-21 NOTE — Telephone Encounter (Signed)
Spoke with Wayne Gonzalez and she reports that if the question is "unknown", we should select "no". She reports that she does this with families as well if they are unaware of the questions. I still have the form. Would you like me to complete the unknown answers, or would you prefer me to give the form back to you? Please advise. Thanks!

## 2017-09-24 NOTE — ED Triage Notes (Signed)
Pt arrives via [facility] with c/o respiratory arrest which became cardiac arrest at 2303. Per EMS, pt was given duoneb x1 pre-arrest and post arrest was given Epi x 4 by EMS. Per EMS, pt called for throat, back, and chest pain in that order. When EMS arrived on scene, pt became diaphoretic and his legs gave out on him.

## 2017-09-24 NOTE — Progress Notes (Signed)
RT NOTE: Patient intubated by Dr Manson PasseyBrown (7.5 et tube 24cm at lip). Positive color change noted with end tidal. Et tube secured. Patient bagged with ambu bag 100% o2. ACLS protocols followed.

## 2017-09-24 NOTE — ED Notes (Addendum)
[facility] Nada BoozerStacey Stowe called at this time. Per [facility], [phone] is the referral number for this case.

## 2017-09-24 NOTE — Progress Notes (Signed)
Page from ED for CPR enroute spiritual support. Provided spiritual and emotional support to family during acute event and at TOD.   CPR enroute. Provided spiritual and emotional support to family during acute event and at TOD.

## 2017-09-24 NOTE — ED Provider Notes (Addendum)
[facility] Emergency Department Provider Note    First MD Initiated Contact with Patient 2017-09-20 [phone]     (approximate)  I have reviewed the triage vital signs and the nursing notes.  Level 5 caveat: History and physical exam limited secondary to cardiac arrest HISTORY  Chief Complaint Cardiac Arrest    HPI Wayne Gonzalez is a 47 y.o. male with below list of chronic medical conditions presents to the emergency department via EMS cardiac arrest.  Per EMS they were notified by the patient's wife that the patient admitted to chest neck back and abdominal pain as well as bilateral lower extremity numbness.  Patient subsequently became unresponsive on EMS arrival and was found to be pulseless.  CPR was initiated at approximately 11:03 PM per EMS.  Patient received 6 epinephrine's before arrival to the emergency department.   Past Medical History:  Diagnosis Date  . Asthmatic bronchitis   . Chest wall muscle strain   . Contusion, chest wall   . Elevated WBC count   . Hyperlipidemia   . Insomnia   . Obesity   . Perineal abscess   . PNA (pneumonia)   . Reactive airway disease   . Rhinitis   . Sinusitis   . Thrush, oral   . Tobacco use    quit 05/14/2007    Patient Active Problem List   Diagnosis Date Noted  . Allergic rhinitis 12/25/2014  . Elevated WBC count 12/25/2014  . Hyperlipidemia 12/25/2014  . Insomnia 12/25/2014  . Obesity 12/25/2014  . RAD (reactive airway disease) 12/25/2014  . COPD (chronic obstructive pulmonary disease) ([place]) 04/23/2013  . Tobacco abuse 04/23/2013  . Abnormal chest x-ray 04/23/2013  . Recurrent pneumonia 04/23/2013  . Disorder of skin 07/05/2008  . 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 HERNIA REPAIR     x 2     Prior to Admission medications   Medication Sig Start Date End Date Taking? Authorizing Provider  albuterol (VENTOLIN HFA) 108 (90 Base) MCG/ACT  inhaler Inhale 2 puffs every 6 (six) hours as needed into the lungs for wheezing or shortness of breath. 05/14/17   Birdie Sons, MD  atorvastatin (LIPITOR) 20 MG tablet Take 1 tablet (20 mg total) daily by mouth. 05/14/17   Birdie Sons, MD  cefdinir (OMNICEF) 300 MG capsule Take 2 capsules (600 mg total) by mouth daily for 10 days. 09/07/17 09/20/2017  Birdie Sons, MD  fish oil-omega-3 fatty acids 1000 MG capsule Take 1 g by mouth daily.    [provider]  fluticasone (FLONASE) 50 MCG/ACT nasal spray Place 2 sprays daily into both nostrils. 05/14/17   Birdie Sons, MD  Fluticasone-Salmeterol (ADVAIR) 250-50 MCG/DOSE AEPB Inhale 1 puff every 12 (twelve) hours into the lungs. 05/14/17   Birdie Sons, MD  Loratadine (CLARITIN) 10 MG CAPS Take 1 capsule by mouth daily.    [provider]  montelukast (SINGULAIR) 10 MG tablet Take 1 tablet (10 mg total) at bedtime by mouth. 05/14/17   Birdie Sons, MD  Multiple Vitamin (MULTI-VITAMIN DAILY PO) Take 1 tablet by mouth daily.    [provider]    Allergies No known drug allergies  Family History  Problem Relation Age of Onset  . Lung cancer Maternal Grandmother        smoked  . Depression Maternal Grandmother   . Hyperlipidemia Father   . Hypertension Father   .  Fibromyalgia Father   . Hypothyroidism Mother   . Emphysema Paternal Grandfather        smoked  . Lung cancer Paternal Grandfather        smoked  . Emphysema Paternal Grandmother        smoked    Social History Social History   Tobacco Use  . Smoking status: Current Every Day Smoker    Packs/day: 1.00    Years: 22.00    Pack years: 22.00    Types: Cigarettes  . Smokeless tobacco: Never Used  Substance Use Topics  . Alcohol use: Yes    Alcohol/week: 0.0 oz    Comment: Rarely Rarely  . Drug use: No    Review of Systems Constitutional: No fever/chills Eyes: No visual changes. ENT: No sore throat. Cardiovascular:  Positive for chest pain. Respiratory: Denies shortness of breath. Gastrointestinal: Positive for abdominal pain.  No nausea, no vomiting.  No diarrhea.  No constipation. Genitourinary: Negative for dysuria. Musculoskeletal: Negative for neck pain.  Negative for back pain. Integumentary: Negative for rash. Neurological: Negative for headaches, reported history of bilateral lower extremity numbness ____________________________________________   PHYSICAL EXAM:  VITAL SIGNS: ED Triage Vitals [13-Oct-2017 0000]  Enc Vitals Group     BP      Pulse Rate (!) 0     Resp (!) 0     Temp (!) 90.2 F (32.3 C)     Temp Source Axillary     SpO2 96 %     Weight 124.7 kg (275 lb)     Height 1.88 m (6' 2" )     Head Circumference      Peak Flow      Pain Score      Pain Loc      Pain Edu?      Excl. in [place]?     Constitutional: Unresponsive  eyes: Pupils fixed bilaterally  Head: Atraumatic. Mouth/Throat: King airway in place breath sounds noted bilaterally Neck: No stridor.   Cardiovascular: Palpable pulses with CPR however no pulses in the absence of CPR. Respiratory: Normal respiratory effort.  No retractions. Lungs CTAB. Gastrointestinal: Soft and nontender.. distention.  Musculoskeletal: No evidence of trauma Neurologic: Unresponsive, Skin:  Skin is warm, dry and intact. No rash noted. Psychiatric: Mood and affect are normal. Speech and behavior are normal.      .Critical Care Performed by: Gregor Hams, MD Authorized by: Gregor Hams, MD   Critical care provider statement:    Critical care time (minutes):  60   Critical care start time:  10/13/17 12:48 AM   Critical care end time:  10/13/2017 2:00 AM   Critical care time was exclusive of:  Separately billable procedures and treating other patients   Critical care was necessary to treat or prevent imminent or life-threatening deterioration of the following conditions:  Cardiac failure and circulatory failure    Critical care was time spent personally by me on the following activities:  Development of treatment plan with patient or surrogate, discussions with consultants, evaluation of patient's response to treatment, examination of patient, obtaining history from patient or surrogate, ordering and performing treatments and interventions, ordering and review of laboratory studies, ordering and review of radiographic studies, pulse oximetry, re-evaluation of patient's condition and review of old charts   I assumed direction of critical care for this patient from another provider in my specialty: no   Procedure Name: Intubation Date/Time: October 13, 2017 7:42 AM Performed by: Gregor Hams, MD Pre-anesthesia Checklist:  Patient identified, Emergency Drugs available, Suction available and Patient being monitored Preoxygenation: Pre-oxygenation with 100% oxygen Laryngoscope Size: Mac and 4 Grade View: Grade II Tube size: 7.5 mm Number of attempts: 1 Placement Confirmation: ETT inserted through vocal cords under direct vision,  CO2 detector and Breath sounds checked- equal and bilateral Dental Injury: Teeth and Oropharynx as per pre-operative assessment  Difficulty Due To: Difficulty was anticipated        ____________________________________________   INITIAL IMPRESSION / ASSESSMENT AND PLAN / ED COURSE  As part of my medical decision making, I reviewed the following data within the electronic MEDICAL RECORD NUMBER  47 year old male presenting to the emergency department above-stated history and physical exam secondary to cardiac arrest.  CPR was conducted as per ACLS guidelines.  Likewise indications were administered as per ACLS guidelines.  Spontaneous circulation was never obtained.  A 12:19 AM patient was pronounced dead.I informed the patient's family of his death.  ____________________________________________  FINAL CLINICAL IMPRESSION(S) / ED DIAGNOSES  Final diagnoses:  Cardiac arrest [facility])      MEDICATIONS GIVEN DURING THIS VISIT:  Medications  EPINEPHrine (ADRENALIN) 1 MG/10ML injection (1 Syringe Intravenous Given 2017/10/03 0017)  calcium chloride injection (1 g Intravenous Given Oct 03, 2017 0003)  sodium bicarbonate injection (100 mEq Intravenous Given 10/03/17 0008)  atropine injection (1 mg Intravenous Given 10-03-2017 0014)     ED Discharge Orders    None       Note:  This document was prepared using Dragon voice recognition software and may include unintentional dictation errors.    Gregor Hams, MD 2017-10-03 [id number]    Gregor Hams, MD 03-Oct-2017 [phone]

## 2017-09-24 DEATH — deceased

## 2017-09-26 NOTE — Telephone Encounter (Signed)
Faxed completed form today

## 2017-12-19 IMAGING — CR DG CHEST 2V
1 series · 2 of 2 positions shown · non-contrast
Comparison: Radiographs January 13, 2015.

CLINICAL DATA: Fever, productive cough.

EXAM:
CHEST  2 VIEW

[Series 1: dg chest 2 view · 0.14mm/px · 2 of 2 slices shown]
[im 1/2]
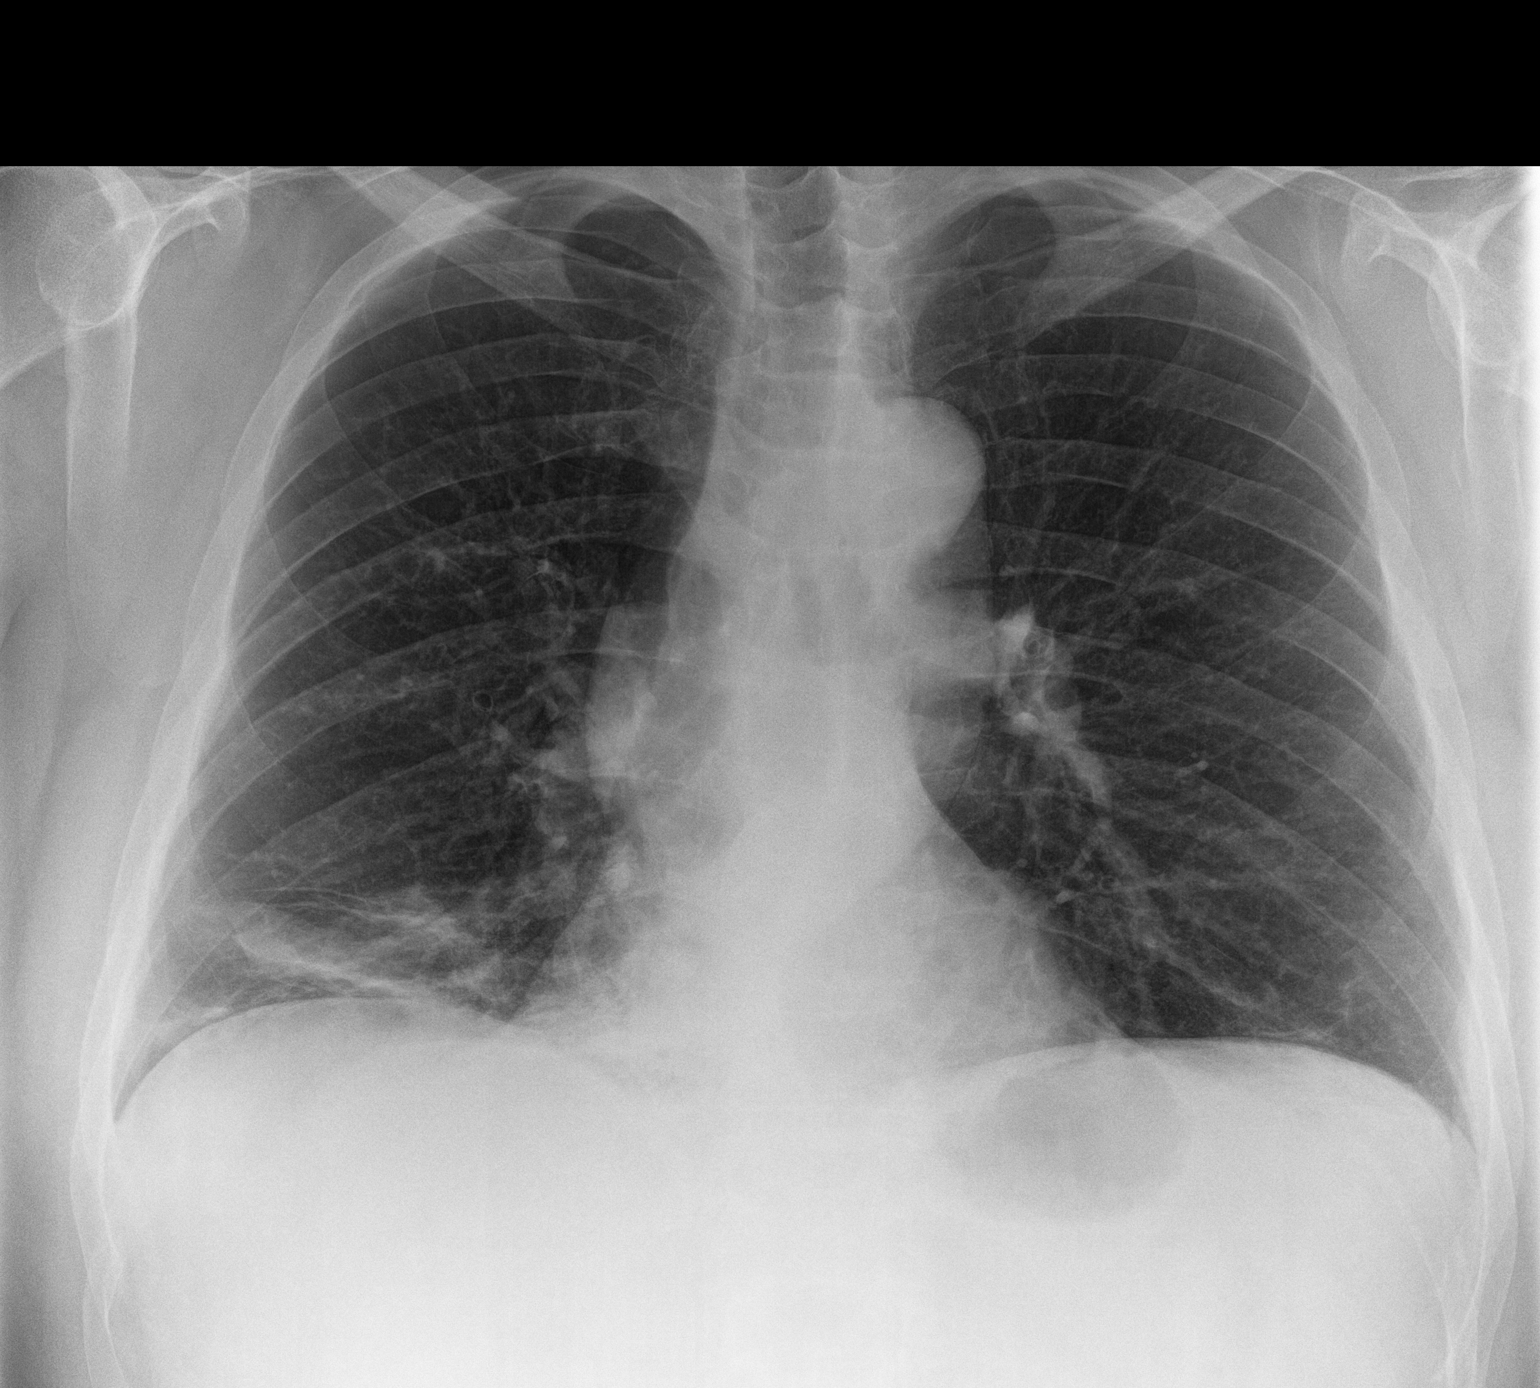
[im 2/2]
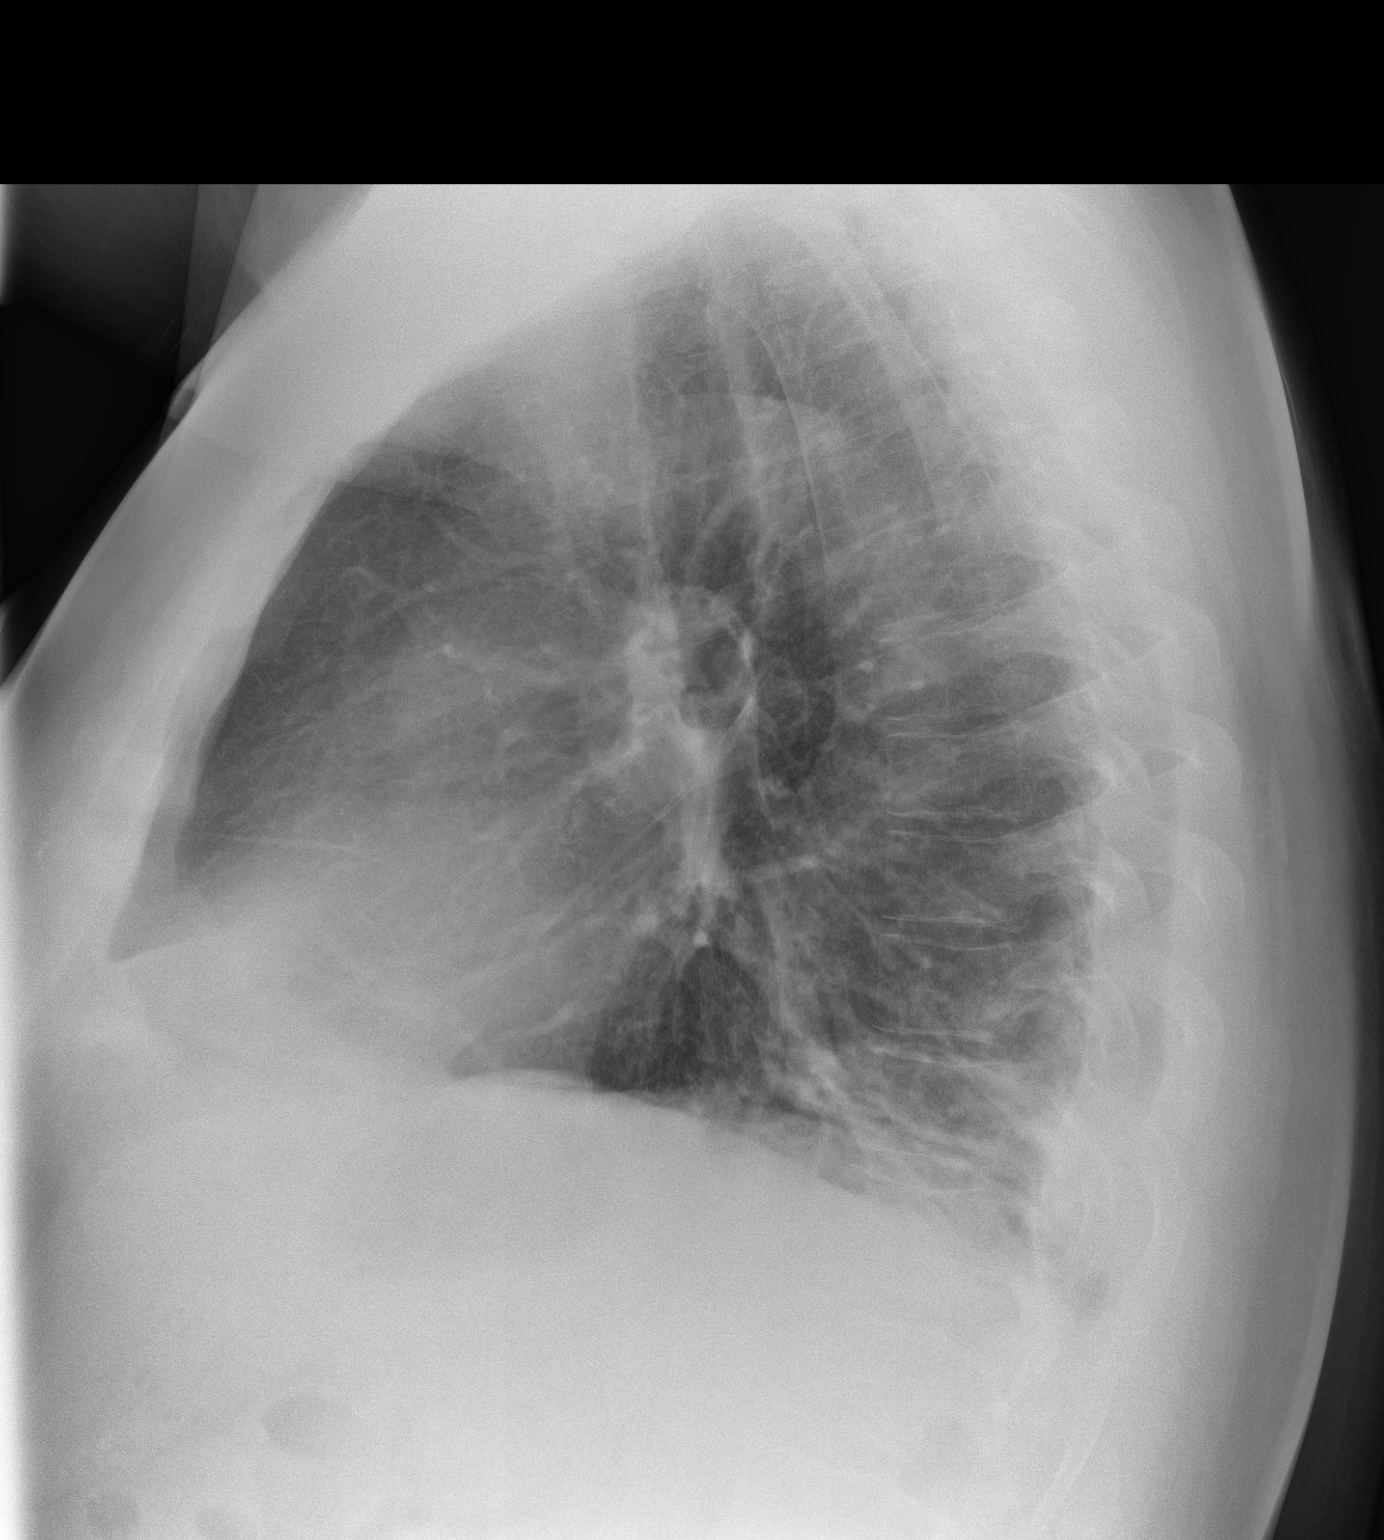

[2 of 2 positions shown; findings below may reference images not displayed]

FINDINGS: The heart size and mediastinal contours are within normal limits. No
pneumothorax or pleural effusion is noted. Minimal left basilar
subsegmental atelectasis is noted. Mild right basilar atelectasis or
pneumonia is noted. The visualized skeletal structures are
unremarkable.
IMPRESSION: Mild right basilar opacity concerning for atelectasis or pneumonia.
Followup PA and lateral chest X-ray is recommended in 3-4 weeks
following trial of antibiotic therapy to ensure resolution and
exclude underlying malignancy.

## 2018-01-14 IMAGING — CR DG CHEST 2V
1 series · 2 of 2 positions shown · non-contrast
Comparison: 11/03/2016

CLINICAL DATA: Follow-up pneumonia.

EXAM:
CHEST  2 VIEW

[Series 1: dg chest 2 view · 0.14mm/px · 2 of 2 slices shown]
[im 1/2]
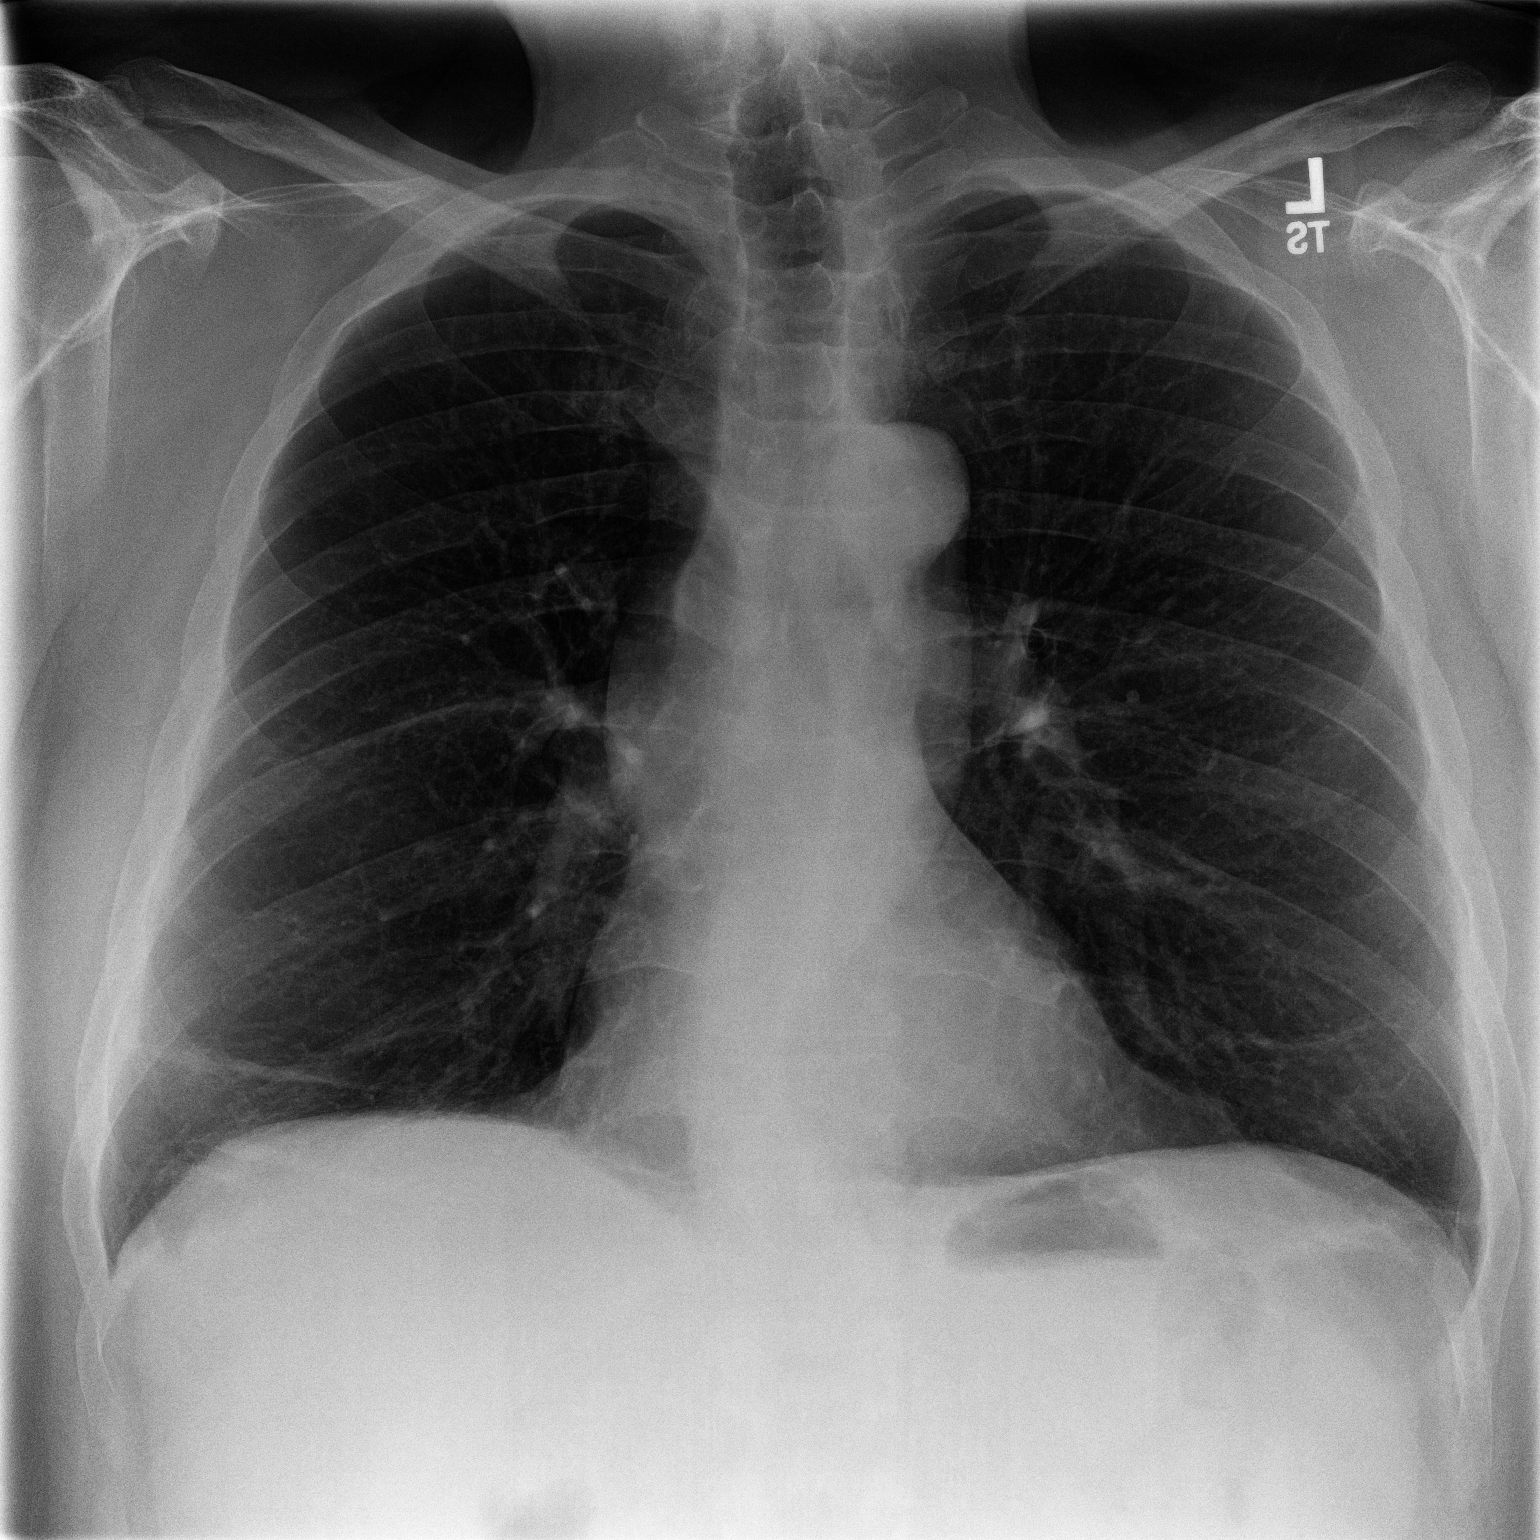
[im 2/2]
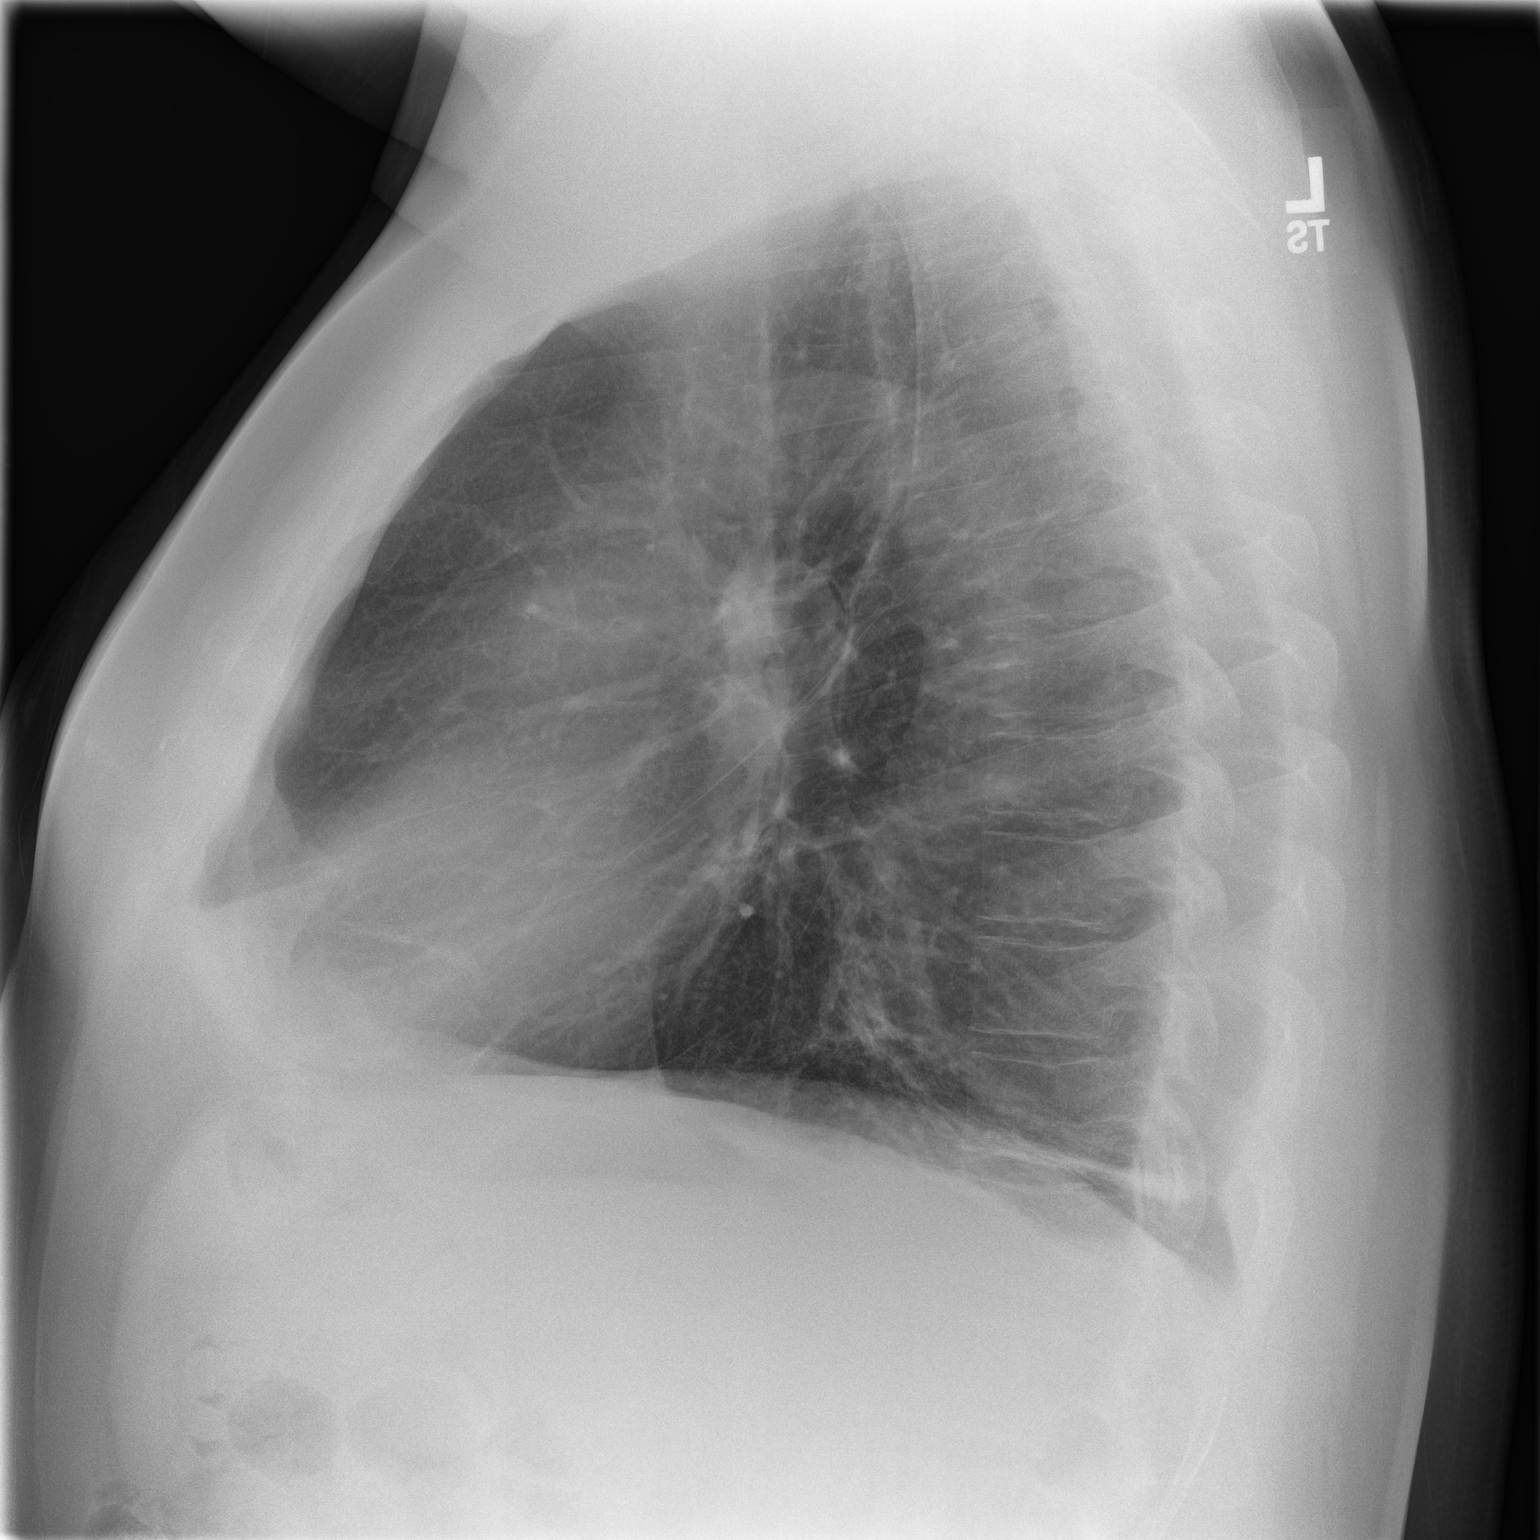

[2 of 2 positions shown; findings below may reference images not displayed]

FINDINGS: Linear atelectasis or scarring in the lung bases. Opacity in the
right base previously has improved. Heart is normal size. No
effusions or acute bony abnormality.
IMPRESSION: Bibasilar scarring or atelectasis.  No active disease.
